# Patient Record
Sex: Male | Born: 1985
Health system: Southern US, Community
[De-identification: ages and names within clinical notes are randomized; demographics above are authoritative.]

## PROBLEM LIST (undated history)

## (undated) DIAGNOSIS — R7303 Prediabetes: Secondary | ICD-10-CM

## (undated) DIAGNOSIS — J45909 Unspecified asthma, uncomplicated: Secondary | ICD-10-CM

## (undated) DIAGNOSIS — E785 Hyperlipidemia, unspecified: Secondary | ICD-10-CM

## (undated) DIAGNOSIS — I10 Essential (primary) hypertension: Secondary | ICD-10-CM

## (undated) DIAGNOSIS — G4730 Sleep apnea, unspecified: Secondary | ICD-10-CM

## (undated) DIAGNOSIS — F419 Anxiety disorder, unspecified: Secondary | ICD-10-CM

## (undated) HISTORY — DX: Anxiety disorder, unspecified: F41.9

## (undated) HISTORY — DX: Unspecified asthma, uncomplicated: J45.909

## (undated) HISTORY — DX: Essential (primary) hypertension: I10

## (undated) HISTORY — PX: NO PAST SURGERIES: SHX2092

---

## 2015-05-21 ENCOUNTER — Ambulatory Visit (INDEPENDENT_AMBULATORY_CARE_PROVIDER_SITE_OTHER): Payer: PRIVATE HEALTH INSURANCE | Admitting: Physician Assistant

## 2015-05-21 ENCOUNTER — Encounter: Payer: Self-pay | Admitting: Physician Assistant

## 2015-05-21 VITALS — BP 150/90 | HR 82 | Temp 98.2°F | Resp 16 | Ht 74.0 in | Wt 260.2 lb

## 2015-05-21 DIAGNOSIS — N200 Calculus of kidney: Secondary | ICD-10-CM | POA: Diagnosis not present

## 2015-05-21 DIAGNOSIS — E782 Mixed hyperlipidemia: Secondary | ICD-10-CM

## 2015-05-21 DIAGNOSIS — Z23 Encounter for immunization: Secondary | ICD-10-CM | POA: Diagnosis not present

## 2015-05-21 DIAGNOSIS — Z7689 Persons encountering health services in other specified circumstances: Secondary | ICD-10-CM

## 2015-05-21 DIAGNOSIS — I10 Essential (primary) hypertension: Secondary | ICD-10-CM | POA: Diagnosis not present

## 2015-05-21 DIAGNOSIS — Z7189 Other specified counseling: Secondary | ICD-10-CM | POA: Diagnosis not present

## 2015-05-21 MED ORDER — HYDROCHLOROTHIAZIDE 12.5 MG PO CAPS: 12.5000 mg | ORAL_CAPSULE | Freq: Every day | ORAL | Status: DC

## 2015-05-21 NOTE — Patient Instructions (Signed)
Hypertension Hypertension, commonly called high blood pressure, is when the force of blood pumping through your arteries is too strong. Your arteries are the blood vessels that carry blood from your heart throughout your body. A blood pressure reading consists of a higher number over a lower number, such as 110/72. The higher number (systolic) is the pressure inside your arteries when your heart pumps. The lower number (diastolic) is the pressure inside your arteries when your heart relaxes. Ideally you want your blood pressure below 120/80. Hypertension forces your heart to work harder to pump blood. Your arteries may become narrow or stiff. Having untreated or uncontrolled hypertension can cause heart attack, stroke, kidney disease, and other problems. RISK FACTORS Some risk factors for high blood pressure are controllable. Others are not.  Risk factors you cannot control include:   Race. You may be at higher risk if you are African American.  Age. Risk increases with age.  Gender. Men are at higher risk than women before age 45 years. After age 65, women are at higher risk than men. Risk factors you can control include:  Not getting enough exercise or physical activity.  Being overweight.  Getting too much fat, sugar, calories, or salt in your diet.  Drinking too much alcohol. SIGNS AND SYMPTOMS Hypertension does not usually cause signs or symptoms. Extremely high blood pressure (hypertensive crisis) may cause headache, anxiety, shortness of breath, and nosebleed. DIAGNOSIS To check if you have hypertension, your health care provider will measure your blood pressure while you are seated, with your arm held at the level of your heart. It should be measured at least twice using the same arm. Certain conditions can cause a difference in blood pressure between your right and left arms. A blood pressure reading that is higher than normal on one occasion does not mean that you need treatment. If  it is not clear whether you have high blood pressure, you may be asked to return on a different day to have your blood pressure checked again. Or, you may be asked to monitor your blood pressure at home for 1 or more weeks. TREATMENT Treating high blood pressure includes making lifestyle changes and possibly taking medicine. Living a healthy lifestyle can help lower high blood pressure. You may need to change some of your habits. Lifestyle changes may include:  Following the DASH diet. This diet is high in fruits, vegetables, and whole grains. It is low in salt, red meat, and added sugars.  Keep your sodium intake below 2,300 mg per day.  Getting at least 30-45 minutes of aerobic exercise at least 4 times per week.  Losing weight if necessary.  Not smoking.  Limiting alcoholic beverages.  Learning ways to reduce stress. Your health care provider may prescribe medicine if lifestyle changes are not enough to get your blood pressure under control, and if one of the following is true:  You are 18-59 years of age and your systolic blood pressure is above 140.  You are 60 years of age or older, and your systolic blood pressure is above 150.  Your diastolic blood pressure is above 90.  You have diabetes, and your systolic blood pressure is over 140 or your diastolic blood pressure is over 90.  You have kidney disease and your blood pressure is above 140/90.  You have heart disease and your blood pressure is above 140/90. Your personal target blood pressure may vary depending on your medical conditions, your age, and other factors. HOME CARE INSTRUCTIONS    Have your blood pressure rechecked as directed by your health care provider.   Take medicines only as directed by your health care provider. Follow the directions carefully. Blood pressure medicines must be taken as prescribed. The medicine does not work as well when you skip doses. Skipping doses also puts you at risk for  problems.  Do not smoke.   Monitor your blood pressure at home as directed by your health care provider. SEEK MEDICAL CARE IF:   You think you are having a reaction to medicines taken.  You have recurrent headaches or feel dizzy.  You have swelling in your ankles.  You have trouble with your vision. SEEK IMMEDIATE MEDICAL CARE IF:  You develop a severe headache or confusion.  You have unusual weakness, numbness, or feel faint.  You have severe chest or abdominal pain.  You vomit repeatedly.  You have trouble breathing. MAKE SURE YOU:   Understand these instructions.  Will watch your condition.  Will get help right away if you are not doing well or get worse.   This information is not intended to replace advice given to you by your health care provider. Make sure you discuss any questions you have with your health care provider.   Document Released: 06/28/2005 Document Revised: 11/12/2014 Document Reviewed: 04/20/2013 Elsevier Interactive Patient Education 2016 Elsevier Inc.  

## 2015-05-21 NOTE — Progress Notes (Signed)
Patient: James Cooley, Male    DOB: [birth year], 29 y.o.   MRN: [id number] Visit Date: 05/21/2015  Today's Provider: Margaretann LovelessJennifer M Burnette, PA-C   Chief Complaint  Patient presents with  . Establish Care  . Annual Exam  . Hypertension   Subjective:    Annual physical exam James Cooley is a 29 y.o. male who presents today for health maintenance and complete physical. He feels well. He reports not exercising. He reports he is sleeping well.  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  He does report having reported high blood pressure for which he has never sought treatment.  He does report a strong family history of hypertension in his mother, father and siblings.  His wife is an Charity fundraiserN at [employer] an checks his BP regularly at home with readings in the 140s-150s/90s. He does not adhere to a heart healthy diet, but states they have been changing their eating habits recently to a healthier diet.   Review of Systems  Constitutional: Negative.   HENT: Negative.   Eyes: Negative.   Respiratory: Negative.   Cardiovascular: Negative.        Hypertension  Gastrointestinal: Negative.   Endocrine: Negative.   Genitourinary: Negative.   Musculoskeletal: Negative.   Skin: Negative.   Allergic/Immunologic: Negative.   Neurological: Negative.   Hematological: Negative.   Psychiatric/Behavioral: Negative.     Social History He  reports that he has never smoked. He has never used smokeless tobacco. He reports that he drinks alcohol. He reports that he does not use illicit drugs. Social History   Social History  . Marital Status: Married    Spouse Name: N/A  . Number of Children: N/A  . Years of Education: N/A   Social History Main Topics  . Smoking status: Never Smoker   . Smokeless tobacco: Never Used  . Alcohol Use: Yes     Comment: 4x a week 1-2 drinks  . Drug Use: No  . Sexual Activity: Not Asked   Other Topics Concern  . None   Social History Narrative  . None     Patient Active Problem List   Diagnosis Date Noted  . Hypertension 05/21/2015  . Kidney stones 05/21/2015    No past surgical history on file.  Family History  Family Status  Relation Status Death Age  . Mother Deceased 2461    breast cancer, pneumonia  . Father Alive   . Maternal Grandmother Alive    His family history includes Depression in his paternal grandmother; Diabetes in his maternal grandmother; Mental illness in his paternal grandmother.    No Known Allergies  Previous Medications   No medications on file    Patient Care Team: Margaretann LovelessJennifer M Burnette, PA-C as PCP - General (Family Medicine)     Objective:   Vitals: BP 150/90 mmHg  Pulse 82  Temp(Src) 98.2 F (36.8 C) (Oral)  Resp 16  Ht 6\' 2"  (1.88 m)  Wt 260 lb 3.2 oz (118.026 kg)  BMI 33.39 kg/m2   Physical Exam  Constitutional: He is oriented to person, place, and time. He appears well-developed and well-nourished.  HENT:  Head: Normocephalic and atraumatic.  Right Ear: External ear normal.  Left Ear: External ear normal.  Nose: Nose normal.  Mouth/Throat: Oropharynx is clear and moist.  Eyes: Conjunctivae and EOM are normal. Pupils are equal, round, and reactive to light. Right eye exhibits no discharge.  Neck: Normal range of motion. Neck supple. No  tracheal deviation present. No thyromegaly present.  Cardiovascular: Normal rate, regular rhythm, normal heart sounds and intact distal pulses.   No murmur heard. Pulmonary/Chest: Effort normal and breath sounds normal. No respiratory distress. He has no wheezes. He has no rales. He exhibits no tenderness.  Abdominal: Soft. He exhibits no distension and no mass. There is no tenderness. There is no rebound and no guarding.  Musculoskeletal: Normal range of motion. He exhibits no edema or tenderness.  Lymphadenopathy:    He has no cervical adenopathy.  Neurological: He is alert and oriented to person, place, and time. He has normal reflexes. No cranial  nerve deficit. He exhibits normal muscle tone. Coordination normal.  Skin: Skin is warm and dry. No rash noted. No erythema.  Psychiatric: He has a normal mood and affect. His behavior is normal. Judgment and thought content normal.     Depression Screen No flowsheet data found.    Assessment & Plan:     Routine Health Maintenance and Physical Exam  1. Encounter to establish care Normal physical exam today.  2. Essential hypertension Will check labs as below.  Starting HCTZ 12.5 mg.  His wife will check his BP at home occasionally over the next 4 weeks.  He is to bring a log when he returns to evaluate how well the medication is working.  Will increase to  if necessary at re-evaluation.  I will f/u pending lab results and also see him back in 4 weeks to recheck BP. - CBC with Differential - Comprehensive Metabolic Panel (CMET) - Lipid panel - TSH - hydrochlorothiazide (MICROZIDE) 12.5 MG capsule; Take 1 capsule (12.5 mg total) by mouth daily.  Dispense: 30 capsule; Refill: 1  3. Kidney stones H/O this.  None recently.  4. Need for influenza vaccination Flu vaccine was given today without complication. - Flu Vaccine QUAD 36+ mos IM   Exercise Activities and Dietary recommendations Goals    None      There is no immunization history for the selected administration types on file for this patient.  Health Maintenance  Topic Date Due  . HIV Screening  10/29/2001  . TETANUS/TDAP  10/29/2005  . INFLUENZA VACCINE  02/10/2015      Discussed health benefits of physical activity, and encouraged him to engage in regular exercise appropriate for his age and condition.    --------------------------------------------------------------------

## 2015-05-28 LAB — CBC WITH DIFFERENTIAL/PLATELET
BASOS ABS: 0 10*3/uL (ref 0.0–0.2)
BASOS: 0 %
EOS (ABSOLUTE): 0.1 10*3/uL (ref 0.0–0.4)
EOS: 1 %
HEMOGLOBIN: 15.6 g/dL (ref 12.6–17.7)
Hematocrit: 44.3 % (ref 37.5–51.0)
IMMATURE GRANS (ABS): 0 10*3/uL (ref 0.0–0.1)
Immature Granulocytes: 0 %
LYMPHS: 30 %
Lymphocytes Absolute: 2.5 10*3/uL (ref 0.7–3.1)
MCH: 30.6 pg (ref 26.6–33.0)
MCHC: 35.2 g/dL (ref 31.5–35.7)
MCV: 87 fL (ref 79–97)
MONOCYTES: 11 %
Monocytes Absolute: 0.9 10*3/uL (ref 0.1–0.9)
NEUTROS ABS: 4.8 10*3/uL (ref 1.4–7.0)
Neutrophils: 58 %
PLATELETS: 292 10*3/uL (ref 150–379)
RBC: 5.09 x10E6/uL (ref 4.14–5.80)
RDW: 12.8 % (ref 12.3–15.4)
WBC: 8.2 10*3/uL (ref 3.4–10.8)

## 2015-05-28 LAB — COMPREHENSIVE METABOLIC PANEL
ALT: 44 IU/L (ref 0–44)
AST: 29 IU/L (ref 0–40)
Albumin/Globulin Ratio: 2.1 (ref 1.1–2.5)
Albumin: 5.3 g/dL (ref 3.5–5.5)
Alkaline Phosphatase: 112 IU/L (ref 39–117)
BUN/Creatinine Ratio: 12 (ref 8–19)
BUN: 18 mg/dL (ref 6–20)
Bilirubin Total: 0.9 mg/dL (ref 0.0–1.2)
CALCIUM: 9.8 mg/dL (ref 8.7–10.2)
CO2: 28 mmol/L (ref 18–29)
CREATININE: 1.46 mg/dL — AB (ref 0.76–1.27)
Chloride: 94 mmol/L — ABNORMAL LOW (ref 97–106)
GFR calc Af Amer: 75 mL/min/{1.73_m2} (ref >59)
GFR, EST NON AFRICAN AMERICAN: 65 mL/min/{1.73_m2} (ref >59)
GLUCOSE: 92 mg/dL (ref 65–99)
Globulin, Total: 2.5 g/dL (ref 1.5–4.5)
POTASSIUM: 4.1 mmol/L (ref 3.5–5.2)
Sodium: 139 mmol/L (ref 136–144)
Total Protein: 7.8 g/dL (ref 6.0–8.5)

## 2015-05-28 LAB — TSH: TSH: 2.52 u[IU]/mL (ref 0.450–4.500)

## 2015-05-28 LAB — LIPID PANEL
CHOL/HDL RATIO: 8.8 ratio — AB (ref 0.0–5.0)
Cholesterol, Total: 238 mg/dL — ABNORMAL HIGH (ref 100–199)
HDL: 27 mg/dL — AB (ref >39)
TRIGLYCERIDES: 1044 mg/dL — AB (ref 0–149)

## 2015-05-28 NOTE — Addendum Note (Signed)
Addended by: Margaretann LovelessBURNETTE, Oveda Dadamo M on: 05/28/2015 01:49 PM   Modules accepted: Orders

## 2015-05-30 ENCOUNTER — Telehealth: Payer: Self-pay

## 2015-05-30 LAB — LIPID PANEL
CHOLESTEROL TOTAL: 219 mg/dL — AB (ref 100–199)
Chol/HDL Ratio: 6.3 ratio units — ABNORMAL HIGH (ref 0.0–5.0)
HDL: 35 mg/dL — ABNORMAL LOW (ref >39)
LDL Calculated: 104 mg/dL — ABNORMAL HIGH (ref 0–99)
Triglycerides: 398 mg/dL — ABNORMAL HIGH (ref 0–149)
VLDL Cholesterol Cal: 80 mg/dL — ABNORMAL HIGH (ref 5–40)

## 2015-05-30 NOTE — Telephone Encounter (Signed)
Patient advised as directed below.  Thanks,  -Xandra Laramee 

## 2015-05-30 NOTE — Telephone Encounter (Signed)
-----   Message from Margaretann LovelessJennifer M Burnette, PA-C sent at 05/30/2015  8:25 AM EST ----- Cholesterol is still slightly elevated, but not as bad as it had been previously. Total cholesterol is 219 (would like to try to get below 200), triglycerides is 398 (would like below 150).  Triglycerides is most closely related to your diet so try to avoid foods with high fat content, fried foods, and high cholesterol foods.  HDL is low at 35 (would like above 40).  This is the good cholesterol that can offer cardioprotection against bad cholesterol.  This can be increased with physical activity.  Try to add moderate physical activity for 30-40 minutes 4-5 days per week. LDL is slightly elevated at 104 (would like below 100).  Try to eat a heart healthy, low fat, low sodium diet and add physical activity.  You may also benefit from 1200mg  fish oil twice daily and/or red yeast rice supplement.  Try to make these lifestyle changes and we will recheck cholesterol in 6 months.  If still elevated like this may benefit from cholesterol lowering medication.

## 2015-06-18 ENCOUNTER — Ambulatory Visit (INDEPENDENT_AMBULATORY_CARE_PROVIDER_SITE_OTHER): Payer: PRIVATE HEALTH INSURANCE | Admitting: Physician Assistant

## 2015-06-18 ENCOUNTER — Encounter: Payer: Self-pay | Admitting: Physician Assistant

## 2015-06-18 VITALS — BP 130/98 | HR 81 | Temp 98.5°F | Resp 16 | Wt 265.2 lb

## 2015-06-18 DIAGNOSIS — I10 Essential (primary) hypertension: Secondary | ICD-10-CM

## 2015-06-18 MED ORDER — HYDROCHLOROTHIAZIDE 25 MG PO TABS: 25.0000 mg | ORAL_TABLET | Freq: Every day | ORAL | Status: DC

## 2015-06-18 NOTE — Progress Notes (Signed)
       Patient: James Cooley Male    DOB: 21-Nov-1985   29 y.o.   MRN: [id number] Visit Date: 06/18/2015  Today's Provider: Margaretann LovelessJennifer M Unnamed Hino, PA-C   Chief Complaint  Patient presents with  . Hypertension   Subjective:    HPI Hypertension: Patient here for follow-up of elevated blood pressure. He is not exercising and is adherent to low salt diet.  Blood pressure readings at home are between 148/90'. Patient blood pressure today is 130/98. Last office visit blood pressure was 150/90.Patient denies chest pain, fatigue, leg swelling, no blurry vision. He is compliant with current treatment. He is on HCTZ 12.5 mg daily.    No Known Allergies Previous Medications   HYDROCHLOROTHIAZIDE (MICROZIDE) 12.5 MG CAPSULE    Take 1 capsule (12.5 mg total) by mouth daily.    Review of Systems  Constitutional: Negative.   HENT: Negative.   Respiratory: Negative.   Cardiovascular: Negative for chest pain, palpitations and leg swelling.  Gastrointestinal: Negative.   Neurological: Negative for dizziness and headaches.    Social History  Substance Use Topics  . Smoking status: Never Smoker   . Smokeless tobacco: Never Used  . Alcohol Use: Yes     Comment: 4x a week 1-2 drinks   Objective:   BP 130/98 mmHg  Pulse 81  Temp(Src) 98.5 F (36.9 C) (Oral)  Resp 16  Wt 265 lb 3.2 oz (120.294 kg)  Physical Exam  Constitutional: He appears well-developed and well-nourished. No distress.  Cardiovascular: Normal rate, regular rhythm and normal heart sounds.  Exam reveals no gallop and no friction rub.   No murmur heard. Pulmonary/Chest: Effort normal and breath sounds normal. No respiratory distress. He has no wheezes. He has no rales.  Skin: He is not diaphoretic.  Vitals reviewed.     Assessment & Plan:     1. Essential hypertension Blood pressure is still not at goal. We will increase HCTZ from 12.5 mg to 25 mg daily. This prescription was filled as below in sent to his pharmacy.  We also discussed adding daily exercise for 30-40 minutes at least 3-4 days per week as well to help with blood pressure and weight loss. He voiced understanding. I will see him back in 2 months to recheck his blood pressure and see if any other medication needs to be added at that time. If he is doing well and blood pressure is at goal of 120/80 we will keep him with HCTZ 25 mg and then I will see him back in 3 months following that to recheck his blood work. He is to call the office in the meantime if he has any acute issues, questions or concerns. - hydrochlorothiazide (HYDRODIURIL) 25 MG tablet; Take 1 tablet (25 mg total) by mouth daily.  Dispense: 30 tablet; Refill: 1       Margaretann LovelessJennifer M Cade Dashner, PA-C  [facility]

## 2015-06-18 NOTE — Patient Instructions (Signed)
Hypertension Hypertension, commonly called high blood pressure, is when the force of blood pumping through your arteries is too strong. Your arteries are the blood vessels that carry blood from your heart throughout your body. A blood pressure reading consists of a higher number over a lower number, such as 110/72. The higher number (systolic) is the pressure inside your arteries when your heart pumps. The lower number (diastolic) is the pressure inside your arteries when your heart relaxes. Ideally you want your blood pressure below 120/80. Hypertension forces your heart to work harder to pump blood. Your arteries may become narrow or stiff. Having untreated or uncontrolled hypertension can cause heart attack, stroke, kidney disease, and other problems. RISK FACTORS Some risk factors for high blood pressure are controllable. Others are not.  Risk factors you cannot control include:   Race. You may be at higher risk if you are African American.  Age. Risk increases with age.  Gender. Men are at higher risk than women before age 45 years. After age 65, women are at higher risk than men. Risk factors you can control include:  Not getting enough exercise or physical activity.  Being overweight.  Getting too much fat, sugar, calories, or salt in your diet.  Drinking too much alcohol. SIGNS AND SYMPTOMS Hypertension does not usually cause signs or symptoms. Extremely high blood pressure (hypertensive crisis) may cause headache, anxiety, shortness of breath, and nosebleed. DIAGNOSIS To check if you have hypertension, your health care provider will measure your blood pressure while you are seated, with your arm held at the level of your heart. It should be measured at least twice using the same arm. Certain conditions can cause a difference in blood pressure between your right and left arms. A blood pressure reading that is higher than normal on one occasion does not mean that you need treatment. If  it is not clear whether you have high blood pressure, you may be asked to return on a different day to have your blood pressure checked again. Or, you may be asked to monitor your blood pressure at home for 1 or more weeks. TREATMENT Treating high blood pressure includes making lifestyle changes and possibly taking medicine. Living a healthy lifestyle can help lower high blood pressure. You may need to change some of your habits. Lifestyle changes may include:  Following the DASH diet. This diet is high in fruits, vegetables, and whole grains. It is low in salt, red meat, and added sugars.  Keep your sodium intake below 2,300 mg per day.  Getting at least 30-45 minutes of aerobic exercise at least 4 times per week.  Losing weight if necessary.  Not smoking.  Limiting alcoholic beverages.  Learning ways to reduce stress. Your health care provider may prescribe medicine if lifestyle changes are not enough to get your blood pressure under control, and if one of the following is true:  You are 18-59 years of age and your systolic blood pressure is above 140.  You are 60 years of age or older, and your systolic blood pressure is above 150.  Your diastolic blood pressure is above 90.  You have diabetes, and your systolic blood pressure is over 140 or your diastolic blood pressure is over 90.  You have kidney disease and your blood pressure is above 140/90.  You have heart disease and your blood pressure is above 140/90. Your personal target blood pressure may vary depending on your medical conditions, your age, and other factors. HOME CARE INSTRUCTIONS    Have your blood pressure rechecked as directed by your health care provider.   Take medicines only as directed by your health care provider. Follow the directions carefully. Blood pressure medicines must be taken as prescribed. The medicine does not work as well when you skip doses. Skipping doses also puts you at risk for  problems.  Do not smoke.   Monitor your blood pressure at home as directed by your health care provider. SEEK MEDICAL CARE IF:   You think you are having a reaction to medicines taken.  You have recurrent headaches or feel dizzy.  You have swelling in your ankles.  You have trouble with your vision. SEEK IMMEDIATE MEDICAL CARE IF:  You develop a severe headache or confusion.  You have unusual weakness, numbness, or feel faint.  You have severe chest or abdominal pain.  You vomit repeatedly.  You have trouble breathing. MAKE SURE YOU:   Understand these instructions.  Will watch your condition.  Will get help right away if you are not doing well or get worse.   This information is not intended to replace advice given to you by your health care provider. Make sure you discuss any questions you have with your health care provider.   Document Released: 06/28/2005 Document Revised: 11/12/2014 Document Reviewed: 04/20/2013 Elsevier Interactive Patient Education 2016 [employer].  Exercising to [employer] Exercising can help you to lose weight. In order to lose weight through exercise, you need to do vigorous-intensity exercise. You can tell that you are exercising with vigorous intensity if you are breathing very hard and fast and cannot hold a conversation while exercising. Moderate-intensity exercise helps to maintain your current weight. You can tell that you are exercising at a moderate level if you have a higher heart rate and faster breathing, but you are still able to hold a conversation. HOW OFTEN SHOULD I EXERCISE? Choose an activity that you enjoy and set realistic goals. Your health care provider can help you to make an activity plan that works for you. Exercise regularly as directed by your health care provider. This may include:  Doing resistance training twice each week, such as:  Push-ups.  Sit-ups.  Lifting weights.  Using resistance bands.  Doing  a given intensity of exercise for a given amount of time. Choose from these options:  150 minutes of moderate-intensity exercise every week.  75 minutes of vigorous-intensity exercise every week.  A mix of moderate-intensity and vigorous-intensity exercise every week. Children, pregnant women, people who are out of shape, people who are overweight, and older adults may need to consult a health care provider for individual recommendations. If you have any sort of medical condition, be sure to consult your health care provider before starting a new exercise program. WHAT ARE SOME ACTIVITIES THAT CAN HELP ME TO LOSE WEIGHT?   Walking at a rate of at least 4.5 miles an hour.  Jogging or running at a rate of 5 miles per hour.  Biking at a rate of at least 10 miles per hour.  Lap swimming.  Roller-skating or in-line skating.  Cross-country skiing.  Vigorous competitive sports, such as football, basketball, and soccer.  Jumping rope.  Aerobic dancing. HOW CAN I BE MORE ACTIVE IN MY DAY-TO-DAY ACTIVITIES?  Use the stairs instead of the elevator.  Take a walk during your lunch break.  If you drive, park your car farther away from work or school.  If you take public transportation, get off one stop early and  walk the rest of the way.  Make all of your phone calls while standing up and walking around.  Get up, stretch, and walk around every 30 minutes throughout the day. WHAT GUIDELINES SHOULD I FOLLOW WHILE EXERCISING?  Do not exercise so much that you hurt yourself, feel dizzy, or get very short of breath.  Consult your health care provider prior to starting a new exercise program.  Wear comfortable clothes and shoes with good support.  Drink plenty of water while you exercise to prevent dehydration or heat stroke. Body water is lost during exercise and must be replaced.  Work out until you breathe faster and your heart beats faster.   This information is not intended to  replace advice given to you by your health care provider. Make sure you discuss any questions you have with your health care provider.   Document Released: 07/31/2010 Document Revised: 07/19/2014 Document Reviewed: 11/29/2013 Elsevier Interactive Patient Education [employer]2016 Elsevier Inc.

## 2015-08-20 ENCOUNTER — Encounter: Payer: Self-pay | Admitting: Physician Assistant

## 2015-08-20 ENCOUNTER — Ambulatory Visit (INDEPENDENT_AMBULATORY_CARE_PROVIDER_SITE_OTHER): Payer: PRIVATE HEALTH INSURANCE | Admitting: Physician Assistant

## 2015-08-20 VITALS — BP 120/82 | HR 79 | Temp 97.6°F | Resp 16 | Wt 264.0 lb

## 2015-08-20 DIAGNOSIS — I10 Essential (primary) hypertension: Secondary | ICD-10-CM | POA: Diagnosis not present

## 2015-08-20 MED ORDER — HYDROCHLOROTHIAZIDE 25 MG PO TABS
25.0000 mg | ORAL_TABLET | Freq: Every day | ORAL | Status: DC
Start: 2015-08-20 — End: 2016-08-18

## 2015-08-20 NOTE — Patient Instructions (Addendum)
Food Choices to Lower Your Triglycerides Triglycerides are a type of fat in your blood. High levels of triglycerides can increase the risk of heart disease and stroke. If your triglyceride levels are high, the foods you eat and your eating habits are very important. Choosing the right foods can help lower your triglycerides.  WHAT GENERAL GUIDELINES DO I NEED TO FOLLOW?  Lose weight if you are overweight.   Limit or avoid alcohol.   Fill one half of your plate with vegetables and green salads.   Limit fruit to two servings a day. Choose fruit instead of juice.   Make one fourth of your plate whole grains. Look for the word "whole" as the first word in the ingredient list.  Fill one fourth of your plate with lean protein foods.  Enjoy fatty fish (such as salmon, mackerel, sardines, and tuna) three times a week.   Choose healthy fats.   Limit foods high in starch and sugar.  Eat more home-cooked food and less restaurant, buffet, and fast food.  Limit fried foods.  Cook foods using methods other than frying.  Limit saturated fats.  Check ingredient lists to avoid foods with partially hydrogenated oils (trans fats) in them. WHAT FOODS CAN I EAT?  Grains Whole grains, such as whole wheat or whole grain breads, crackers, cereals, and pasta. Unsweetened oatmeal, bulgur, barley, quinoa, or brown rice. Corn or whole wheat flour tortillas.  Vegetables Fresh or frozen vegetables (raw, steamed, roasted, or grilled). Green salads. Fruits All fresh, canned (in natural juice), or frozen fruits. Meat and Other Protein Products Ground beef (85% or leaner), grass-fed beef, or beef trimmed of fat. Skinless chicken or turkey. Ground chicken or turkey. Pork trimmed of fat. All fish and seafood. Eggs. Dried beans, peas, or lentils. Unsalted nuts or seeds. Unsalted canned or dry beans. Dairy Low-fat dairy products, such as skim or 1% milk, 2% or reduced-fat cheeses, low-fat ricotta or cottage  cheese, or plain low-fat yogurt. Fats and Oils Tub margarines without trans fats. Light or reduced-fat mayonnaise and salad dressings. Avocado. Safflower, olive, or canola oils. Natural peanut or almond butter. The items listed above may not be a complete list of recommended foods or beverages. Contact your dietitian for more options. WHAT FOODS ARE NOT RECOMMENDED?  Grains White bread. White pasta. White rice. Cornbread. Bagels, pastries, and croissants. Crackers that contain trans fat. Vegetables White potatoes. Corn. Creamed or fried vegetables. Vegetables in a cheese sauce. Fruits Dried fruits. Canned fruit in light or heavy syrup. Fruit juice. Meat and Other Protein Products Fatty cuts of meat. Ribs, chicken wings, bacon, sausage, bologna, salami, chitterlings, fatback, hot dogs, bratwurst, and packaged luncheon meats. Dairy Whole or 2% milk, cream, half-and-half, and cream cheese. Whole-fat or sweetened yogurt. Full-fat cheeses. Nondairy creamers and whipped toppings. Processed cheese, cheese spreads, or cheese curds. Sweets and Desserts Corn syrup, sugars, honey, and molasses. Candy. Jam and jelly. Syrup. Sweetened cereals. Cookies, pies, cakes, donuts, muffins, and ice cream. Fats and Oils Butter, stick margarine, lard, shortening, ghee, or bacon fat. Coconut, palm kernel, or palm oils. Beverages Alcohol. Sweetened drinks (such as sodas, lemonade, and fruit drinks or punches). The items listed above may not be a complete list of foods and beverages to avoid. Contact your dietitian for more information.   This information is not intended to replace advice given to you by your health care provider. Make sure you discuss any questions you have with your health care provider.   Document Released: 04/15/2004   Document Revised: 07/19/2014 Document Reviewed: 05/02/2013 Elsevier Interactive Patient Education 2016 [employer] Hypertension Hypertension, commonly called high blood  pressure, is when the force of blood pumping through your arteries is too strong. Your arteries are the blood vessels that carry blood from your heart throughout your body. A blood pressure reading consists of a higher number over a lower number, such as 110/72. The higher number (systolic) is the pressure inside your arteries when your heart pumps. The lower number (diastolic) is the pressure inside your arteries when your heart relaxes. Ideally you want your blood pressure below 120/80. Hypertension forces your heart to work harder to pump blood. Your arteries may become narrow or stiff. Having untreated or uncontrolled hypertension can cause heart attack, stroke, kidney disease, and other problems. RISK FACTORS Some risk factors for high blood pressure are controllable. Others are not.  Risk factors you cannot control include:   Race. You may be at higher risk if you are African American.  Age. Risk increases with age.  Gender. Men are at higher risk than women before age 29 years. After age 29, women are at higher risk than men. Risk factors you can control include:  Not getting enough exercise or physical activity.  Being overweight.  Getting too much fat, sugar, calories, or salt in your diet.  Drinking too much alcohol. SIGNS AND SYMPTOMS Hypertension does not usually cause signs or symptoms. Extremely high blood pressure (hypertensive crisis) may cause headache, anxiety, shortness of breath, and nosebleed. DIAGNOSIS To check if you have hypertension, your health care provider will measure your blood pressure while you are seated, with your arm held at the level of your heart. It should be measured at least twice using the same arm. Certain conditions can cause a difference in blood pressure between your right and left arms. A blood pressure reading that is higher than normal on one occasion does not mean that you need treatment. If it is not clear whether you have high blood pressure,  you may be asked to return on a different day to have your blood pressure checked again. Or, you may be asked to monitor your blood pressure at home for 1 or more weeks. TREATMENT Treating high blood pressure includes making lifestyle changes and possibly taking medicine. Living a healthy lifestyle can help lower high blood pressure. You may need to change some of your habits. Lifestyle changes may include:  Following the DASH diet. This diet is high in fruits, vegetables, and whole grains. It is low in salt, red meat, and added sugars.  Keep your sodium intake below 2,300 mg per day.  Getting at least 30-45 minutes of aerobic exercise at least 4 times per week.  Losing weight if necessary.  Not smoking.  Limiting alcoholic beverages.  Learning ways to reduce stress. Your health care provider may prescribe medicine if lifestyle changes are not enough to get your blood pressure under control, and if one of the following is true:  You are 5318-29 years of age and your systolic blood pressure is above 140.  You are 29 years of age or older, and your systolic blood pressure is above 150.  Your diastolic blood pressure is above 90.  You have diabetes, and your systolic blood pressure is over 140 or your diastolic blood pressure is over 90.  You have kidney disease and your blood pressure is above 140/90.  You have heart disease and your blood pressure is above 140/90. Your personal target blood pressure may  vary depending on your medical conditions, your age, and other factors. HOME CARE INSTRUCTIONS  Have your blood pressure rechecked as directed by your health care provider.   Take medicines only as directed by your health care provider. Follow the directions carefully. Blood pressure medicines must be taken as prescribed. The medicine does not work as well when you skip doses. Skipping doses also puts you at risk for problems.  Do not smoke.   Monitor your blood pressure at home  as directed by your health care provider. SEEK MEDICAL CARE IF:   You think you are having a reaction to medicines taken.  You have recurrent headaches or feel dizzy.  You have swelling in your ankles.  You have trouble with your vision. SEEK IMMEDIATE MEDICAL CARE IF:  You develop a severe headache or confusion.  You have unusual weakness, numbness, or feel faint.  You have severe chest or abdominal pain.  You vomit repeatedly.  You have trouble breathing. MAKE SURE YOU:   Understand these instructions.  Will watch your condition.  Will get help right away if you are not doing well or get worse.   This information is not intended to replace advice given to you by your health care provider. Make sure you discuss any questions you have with your health care provider.   Document Released: 06/28/2005 Document Revised: 11/12/2014 Document Reviewed: 04/20/2013 Elsevier Interactive Patient Education 2016 Elsevier Inc.  

## 2015-08-20 NOTE — Progress Notes (Signed)
       Patient: James Cooley Male    DOB: 1986-02-06   29 y.o.   MRN: [id number] Visit Date: 08/20/2015  Today's Provider: Margaretann Loveless, PA-C   Chief Complaint  Patient presents with  . Follow-up    HTN   Subjective:    HPI  Hypertension, follow-up:  BP Readings from Last 3 Encounters:  08/20/15 120/82  06/18/15 130/98  05/21/15 150/90    He was last seen for hypertension 2 months ago.  BP at that visit was 130/98. Management since that visit includes none. He reports good compliance with treatment. He is not having side effects.  He is exercising. He is running on the weekends. 2 miles. He is adherent to low salt diet.   Outside blood pressures are 120-125 and 80/90  He is experiencing none.  Patient denies chest pain, chest pressure/discomfort, fatigue, irregular heart beat and lower extremity edema.       Weight trend:  Wt Readings from Last 3 Encounters:  08/20/15 264 lb (119.75 kg)  06/18/15 265 lb 3.2 oz (120.294 kg)  05/21/15 260 lb 3.2 oz (118.026 kg)    Current diet: Paleo Diet.  ------------------------------------------------------------------------      No Known Allergies Previous Medications   HYDROCHLOROTHIAZIDE (HYDRODIURIL) 25 MG TABLET    Take 1 tablet (25 mg total) by mouth daily.    Review of Systems  Constitutional: Negative.   Respiratory: Negative.   Cardiovascular: Negative.   Gastrointestinal: Negative.   Genitourinary: Negative.   Neurological: Negative.     Social History  Substance Use Topics  . Smoking status: Never Smoker   . Smokeless tobacco: Never Used  . Alcohol Use: Yes     Comment: 4x a week 1-2 drinks   Objective:   BP 120/82 mmHg  Pulse 79  Temp(Src) 97.6 F (36.4 C) (Oral)  Resp 16  Wt 264 lb (119.75 kg)  Physical Exam  Constitutional: He appears well-developed and well-nourished. No distress.  HENT:  Head: Normocephalic and atraumatic.  Neck: Normal range of motion. Neck supple. No JVD  present. No tracheal deviation present. No thyromegaly present.  Cardiovascular: Normal rate, regular rhythm and normal heart sounds.  Exam reveals no gallop and no friction rub.   No murmur heard. Pulmonary/Chest: Effort normal and breath sounds normal. No respiratory distress. He has no wheezes. He has no rales.  Musculoskeletal: He exhibits no edema.  Lymphadenopathy:    He has no cervical adenopathy.  Skin: He is not diaphoretic.  Vitals reviewed.       Assessment & Plan:     1. Essential hypertension Blood pressure is now stable at current dose of hydrochlorothiazide 25 mg.  He is trying to make lifestyle changes including following a heart healthy diet and incorporating physical activity. I will see him back in 6 months and we will see how his blood pressure is doing at that time. Also at that time he will need to have labs checked to check kidney function on the hydrochlorothiazide as well as rechecking his cholesterol levels. If cholesterol is still elevated at that time we'll most likely start cholesterol-lowering medication. He is to call the office if he has any acute issues, questions or concerns in the meantime. - hydrochlorothiazide (HYDRODIURIL) 25 MG tablet; Take 1 tablet (25 mg total) by mouth daily.  Dispense: 90 tablet; Refill: 3       Margaretann Loveless, PA-C  [facility]

## 2015-11-25 ENCOUNTER — Ambulatory Visit (INDEPENDENT_AMBULATORY_CARE_PROVIDER_SITE_OTHER): Payer: PRIVATE HEALTH INSURANCE | Admitting: Physician Assistant

## 2015-11-25 ENCOUNTER — Encounter: Payer: Self-pay | Admitting: Physician Assistant

## 2015-11-25 VITALS — BP 160/100 | HR 73 | Temp 98.2°F | Resp 16 | Wt 267.8 lb

## 2015-11-25 DIAGNOSIS — L508 Other urticaria: Secondary | ICD-10-CM | POA: Diagnosis not present

## 2015-11-25 MED ORDER — HYDROXYZINE HCL 10 MG PO TABS: 10.0000 mg | ORAL_TABLET | Freq: Three times a day (TID) | ORAL | Status: DC | PRN

## 2015-11-25 NOTE — Progress Notes (Signed)
Patient: James Cooley Male    DOB: [birth year]   30 y.o.   MRN: [id number] Visit Date: 11/25/2015  Today's Provider: Margaretann LovelessJennifer M Burnette, PA-C   Chief Complaint  Patient presents with  . Rash   Subjective:    Rash This is a new problem. The current episode started in the past 7 days (Rash appeared Saturday night got worse on Sunday.). The problem has been gradually improving (has slowly been improving since yesterday) since onset. The affected locations include the chest, abdomen, groin, left upper leg, right buttock, left buttock, right upper leg, back and torso. The rash is characterized by itchiness, redness and swelling. He was exposed to a new medication and an insect bite/sting (possible mosquitos and recent amoxicillin). Pertinent negatives include no anorexia, congestion, cough, diarrhea, fatigue, fever, rhinorrhea, shortness of breath or vomiting. Past treatments include antihistamine and anti-itch cream (Took Benadryl this morning 25 MG, and being doing epsom salt bath.). The treatment provided no relief. There is no history of allergies, asthma, eczema or varicella.   Patient was on antibiotic, Amoxil, and finished it on Saturday. He was also prescribed Prednisone but patient did not take it.[facility]. He was seen on 11/11/15 and completed a 10 day course of amoxicillin. Last pill was Saturday morning before rash started.     No Known Allergies Previous Medications   HYDROCHLOROTHIAZIDE (HYDRODIURIL) 25 MG TABLET    Take 1 tablet (25 mg total) by mouth daily.   PROAIR HFA 108 (90 BASE) MCG/ACT INHALER    INHALE 2 PUFFS EVERY 6 HOURS    Review of Systems  Constitutional: Negative for fever and fatigue.  HENT: Negative for congestion and rhinorrhea.   Respiratory: Negative for cough, chest tightness, shortness of breath and wheezing.   Cardiovascular: Negative for chest pain, palpitations and leg swelling.    Gastrointestinal: Negative for nausea, vomiting, abdominal pain, diarrhea and anorexia.  Musculoskeletal: Negative for arthralgias, neck pain and neck stiffness.  Skin: Positive for rash.  Neurological: Negative for dizziness and weakness.    Social History  Substance Use Topics  . Smoking status: Never Smoker   . Smokeless tobacco: Never Used  . Alcohol Use: Yes     Comment: 4x a week 1-2 drinks   Objective:   BP 160/100 mmHg  Pulse 73  Temp(Src) 98.2 F (36.8 C) (Oral)  Resp 16  Wt 267 lb 12.8 oz (121.473 kg)  Physical Exam  Constitutional: He appears well-developed and well-nourished. No distress.  HENT:  Head: Normocephalic and atraumatic.  Neck: Normal range of motion. Neck supple. No JVD present. No tracheal deviation present. No thyromegaly present.  Cardiovascular: Normal rate, regular rhythm and normal heart sounds.  Exam reveals no gallop and no friction rub.   No murmur heard. Pulmonary/Chest: Effort normal and breath sounds normal. No respiratory distress. He has no wheezes. He has no rales.  Lymphadenopathy:    He has no cervical adenopathy.  Skin: Rash noted. Rash is urticarial (located on upper chest/neck above collar bone, groin, inner thighs, posterior thighs, buttocks and back). He is not diaphoretic.  Vitals reviewed.       Assessment & Plan:     1. Urticaria, acute Will give hydroxyzine for nighttime use to help with nighttime itching and help sleep. Advised to try zantac as well and to take the 6-day prednisone taper he was given by [facility]. He is to call if rash does not improve  or worsens. - hydrOXYzine (ATARAX/VISTARIL) 10 MG tablet; Take 1 tablet (10 mg total) by mouth 3 (three) times daily as needed. Drowsiness precautions.  Dispense: 30 tablet; Refill: 0       Margaretann Loveless, PA-C  [facility]

## 2015-11-25 NOTE — Patient Instructions (Addendum)
Hives Hives are itchy, red, swollen areas of the skin. They can vary in size and location on your body. Hives can come and go for hours or several days (acute hives) or for several weeks (chronic hives). Hives do not spread from person to person (noncontagious). They may get worse with scratching, exercise, and emotional stress. CAUSES   Allergic reaction to food, additives, or drugs.  Infections, including the common cold.  Illness, such as vasculitis, lupus, or thyroid disease.  Exposure to sunlight, heat, or cold.  Exercise.  Stress.  Contact with chemicals. SYMPTOMS   Red or white swollen patches on the skin. The patches may change size, shape, and location quickly and repeatedly.  Itching.  Swelling of the hands, feet, and face. This may occur if hives develop deeper in the skin. DIAGNOSIS  Your caregiver can usually tell what is wrong by performing a physical exam. Skin or blood tests may also be done to determine the cause of your hives. In some cases, the cause cannot be determined. TREATMENT  Mild cases usually get better with medicines such as antihistamines. Severe cases may require an emergency epinephrine injection. If the cause of your hives is known, treatment includes avoiding that trigger.  HOME CARE INSTRUCTIONS   Avoid causes that trigger your hives.  Take antihistamines as directed by your caregiver to reduce the severity of your hives. Non-sedating or low-sedating antihistamines are usually recommended. Do not drive while taking an antihistamine.  Take any other medicines prescribed for itching as directed by your caregiver.  Wear loose-fitting clothing.  Keep all follow-up appointments as directed by your caregiver. SEEK MEDICAL CARE IF:   You have persistent or severe itching that is not relieved with medicine.  You have painful or swollen joints. SEEK IMMEDIATE MEDICAL CARE IF:   You have a fever.  Your tongue or lips are swollen.  You have  trouble breathing or swallowing.  You feel tightness in the throat or chest.  You have abdominal pain. These problems may be the first sign of a life-threatening allergic reaction. Call your local emergency services (911 in [place] MAKE SURE YOU:   Understand these instructions.  Will watch your condition.  Will get help right away if you are not doing well or get worse.   This information is not intended to replace advice given to you by your health care provider. Make sure you discuss any questions you have with your health care provider.   Document Released: 06/28/2005 Document Revised: 07/03/2013 Document Reviewed: 09/21/2011 Elsevier Interactive Patient Education 2016 Elsevier Inc.  Hydroxyzine capsules or tablets What is this medicine? HYDROXYZINE (hye DROX i zeen) is an antihistamine. This medicine is used to treat allergy symptoms. It is also used to treat anxiety and tension. This medicine can be used with other medicines to induce sleep before surgery. This medicine may be used for other purposes; ask your health care provider or pharmacist if you have questions. What should I tell my health care provider before I take this medicine? They need to know if you have any of these conditions: -any chronic illness -difficulty passing urine -glaucoma -heart disease -kidney disease -liver disease -lung disease -an unusual or allergic reaction to hydroxyzine, cetirizine, other medicines, foods, dyes, or preservatives -pregnant or trying to get pregnant -breast-feeding How should I use this medicine? Take this medicine by mouth with a full glass of water. Follow the directions on the prescription label. You may take this medicine with food or on an   empty stomach. Take your medicine at regular intervals. Do not take your medicine more often than directed. Talk to your pediatrician regarding the use of this medicine in children. Special care may be needed. While this drug may be  prescribed for children as young as 6 years of age for selected conditions, precautions do apply. Patients over 65 years old may have a stronger reaction and need a smaller dose. Overdosage: If you think you have taken too much of this medicine contact a poison control center or emergency room at once. NOTE: This medicine is only for you. Do not share this medicine with others. What if I miss a dose? If you miss a dose, take it as soon as you can. If it is almost time for your next dose, take only that dose. Do not take double or extra doses. What may interact with this medicine? -alcohol -barbiturate medicines for sleep or seizures -medicines for colds, allergies -medicines for depression, anxiety, or emotional disturbances -medicines for pain -medicines for sleep -muscle relaxants This list may not describe all possible interactions. Give your health care provider a list of all the medicines, herbs, non-prescription drugs, or dietary supplements you use. Also tell them if you smoke, drink alcohol, or use illegal drugs. Some items may interact with your medicine. What should I watch for while using this medicine? Tell your doctor or health care professional if your symptoms do not improve. You may get drowsy or dizzy. Do not drive, use machinery, or do anything that needs mental alertness until you know how this medicine affects you. Do not stand or sit up quickly, especially if you are an older patient. This reduces the risk of dizzy or fainting spells. Alcohol may interfere with the effect of this medicine. Avoid alcoholic drinks. Your mouth may get dry. Chewing sugarless gum or sucking hard candy, and drinking plenty of water may help. Contact your doctor if the problem does not go away or is severe. This medicine may cause dry eyes and blurred vision. If you wear contact lenses you may feel some discomfort. Lubricating drops may help. See your eye doctor if the problem does not go away or is  severe. If you are receiving skin tests for allergies, tell your doctor you are using this medicine. What side effects may I notice from receiving this medicine? Side effects that you should report to your doctor or health care professional as soon as possible: -fast or irregular heartbeat -difficulty passing urine -seizures -slurred speech or confusion -tremor Side effects that usually do not require medical attention (report to your doctor or health care professional if they continue or are bothersome): -constipation -drowsiness -fatigue -headache -stomach upset This list may not describe all possible side effects. Call your doctor for medical advice about side effects. You may report side effects to FDA at 1-800-FDA-1088. Where should I keep my medicine? Keep out of the reach of children. Store at room temperature between 15 and 30 degrees C (59 and 86 degrees F). Keep container tightly closed. Throw away any unused medicine after the expiration date. NOTE: This sheet is a summary. It may not cover all possible information. If you have questions about this medicine, talk to your doctor, pharmacist, or health care provider.    2016, Elsevier/Gold Standard. (2007-11-10 14:50:59)  

## 2016-02-11 ENCOUNTER — Ambulatory Visit (INDEPENDENT_AMBULATORY_CARE_PROVIDER_SITE_OTHER): Payer: PRIVATE HEALTH INSURANCE | Admitting: Physician Assistant

## 2016-02-11 ENCOUNTER — Encounter: Payer: Self-pay | Admitting: Physician Assistant

## 2016-02-11 VITALS — BP 138/90 | HR 88 | Temp 98.0°F | Resp 16 | Wt 269.0 lb

## 2016-02-11 DIAGNOSIS — E669 Obesity, unspecified: Secondary | ICD-10-CM

## 2016-02-11 DIAGNOSIS — I10 Essential (primary) hypertension: Secondary | ICD-10-CM | POA: Diagnosis not present

## 2016-02-11 DIAGNOSIS — Z6834 Body mass index (BMI) 34.0-34.9, adult: Secondary | ICD-10-CM

## 2016-02-11 DIAGNOSIS — E782 Mixed hyperlipidemia: Secondary | ICD-10-CM | POA: Insufficient documentation

## 2016-02-11 NOTE — Progress Notes (Signed)
Patient: James Cooley Male    DOB: 03-20-1986   30 y.o.   MRN: [id number] Visit Date: 02/11/2016  Today's Provider: Margaretann Loveless, PA-C   Chief Complaint  Patient presents with  . Follow-up    HTN   Subjective:    HPI  Hypertension, follow-up:  BP Readings from Last 3 Encounters:  02/11/16 138/90  11/25/15 (!) 160/100  08/20/15 120/82    He was last seen for hypertension 6 months ago.  BP at that visit was 120/82. Management since that visit includes None. He reports excellent compliance with treatment. He is not having side effects.  He is exercising. Runs a mile on the weekends. He is adherent to low salt diet.   Outside blood pressures are 130's-120's and 80's. He is experiencing none.  Patient denies chest pain, chest pressure/discomfort, exertional chest pressure/discomfort, fatigue, irregular heart beat, lower extremity edema and palpitations.     Weight trend: stable Wt Readings from Last 3 Encounters:  02/11/16 269 lb (122 kg)  11/25/15 267 lb 12.8 oz (121.5 kg)  08/20/15 264 lb (119.7 kg)    Current diet: in general, a "healthy" diet  Still doing the Paleo diet.  ------------------------------------------------------------------------     Allergies  Allergen Reactions  . Amoxicillin Rash   Current Meds  Medication Sig  . hydrochlorothiazide (HYDRODIURIL) 25 MG tablet Take 1 tablet (25 mg total) by mouth daily.  . hydrOXYzine (ATARAX/VISTARIL) 10 MG tablet Take 1 tablet (10 mg total) by mouth 3 (three) times daily as needed. Drowsiness precautions.  . Multiple Vitamin (MULTI-VITAMINS) TABS Take by mouth.  . Omega-3 Fatty Acids (FISH OIL OMEGA-3 PO) Take by mouth.    Review of Systems  Constitutional: Negative.   Respiratory: Negative.   Cardiovascular: Negative.   Gastrointestinal: Negative.   Neurological: Negative.     Social History  Substance Use Topics  . Smoking status: Never Smoker  . Smokeless tobacco: Never Used    . Alcohol use Yes     Comment: 4x a week 1-2 drinks   Objective:   BP 138/90 (BP Location: Right Arm, Patient Position: Sitting, Cuff Size: Normal)   Pulse 88   Temp 98 F (36.7 C) (Oral)   Resp 16   Wt 269 lb (122 kg)   BMI 34.54 kg/m   Physical Exam  Constitutional: He appears well-developed and well-nourished. No distress.  HENT:  Head: Normocephalic and atraumatic.  Cardiovascular: Normal rate, regular rhythm and normal heart sounds.  Exam reveals no gallop and no friction rub.   No murmur heard. Pulmonary/Chest: Effort normal and breath sounds normal. No respiratory distress. He has no wheezes. He has no rales.  Musculoskeletal: He exhibits no edema.  Skin: He is not diaphoretic.  Vitals reviewed.     Assessment & Plan:     1. Essential hypertension Stable.Continue current medical treatment plan with HCTZ 25mg .Will check labs as below and f/u pending results. - Comprehensive metabolic panel - CBC with Differential - Amb ref to Medical Nutrition Therapy-MNT  2. Obesity Will check labs as below and f/u pending results. Will also refer for nutrition counseling. He has been trying to diet and exercise more but has gained 5 pounds over last 6 months. - Comprehensive metabolic panel - Lipid panel - CBC with Differential - Amb ref to Medical Nutrition Therapy-MNT  3. Body mass index (BMI) of 34.0-34.9 in adult See above medical treatment plan. - Comprehensive metabolic panel - CBC with Differential  4.  Hypercholesterolemia with hypertriglyceridemia Working on lifestyle modifications and taking fish oil 1200 mg bid. Will recheck labs and f/u pending results.  - Lipid panel      Margaretann Loveless, PA-C  [facility]

## 2016-02-11 NOTE — Patient Instructions (Signed)
Fat and Cholesterol Restricted Diet  Getting too much fat and cholesterol in your diet may cause health problems. Following this diet helps keep your fat and cholesterol at normal levels. This can keep you from getting sick.  WHAT TYPES OF FAT SHOULD I CHOOSE?  · Choose monosaturated and polyunsaturated fats. These are found in foods such as olive oil, canola oil, flaxseeds, walnuts, almonds, and seeds.  · Eat more omega-3 fats. Good choices include salmon, mackerel, sardines, tuna, flaxseed oil, and ground flaxseeds.  · Limit saturated fats. These are in animal products such as meats, butter, and cream. They can also be in plant products such as palm oil, palm kernel oil, and coconut oil.  ·  Avoid foods with partially hydrogenated oils in them. These contain trans fats. Examples of foods that have trans fats are stick margarine, some tub margarines, cookies, crackers, and other baked goods.  WHAT GENERAL GUIDELINES DO I NEED TO FOLLOW?   · Check food labels. Look for the words "trans fat" and "saturated fat."  · When preparing a meal:    Fill half of your plate with vegetables and green salads.    Fill one fourth of your plate with whole grains. Look for the word "whole" as the first word in the ingredient list.    Fill one fourth of your plate with lean protein foods.  · Limit fruit to two servings a day. Choose fruit instead of juice.  · Eat more foods with soluble fiber. Examples of foods with this type of fiber are apples, broccoli, carrots, beans, peas, and barley. Try to get 20-30 g (grams) of fiber per day.  · Eat more home-cooked foods. Eat less at restaurants and buffets.  · Limit or avoid alcohol.  · Limit foods high in starch and sugar.  · Limit fried foods.  · Cook foods without frying them. Baking, boiling, grilling, and broiling are all great options.  · Lose weight if you are overweight. Losing even a small amount of weight can help your overall health. It can also help prevent diseases such as  diabetes and heart disease.  WHAT FOODS CAN I EAT?  Grains  Whole grains, such as whole wheat or whole grain breads, crackers, cereals, and pasta. Unsweetened oatmeal, bulgur, barley, quinoa, or brown rice. Corn or whole wheat flour tortillas.  Vegetables  Fresh or frozen vegetables (raw, steamed, roasted, or grilled). Green salads.  Fruits  All fresh, canned (in natural juice), or frozen fruits.  Meat and Other Protein Products  Ground beef (85% or leaner), grass-fed beef, or beef trimmed of fat. Skinless chicken or turkey. Ground chicken or turkey. Pork trimmed of fat. All fish and seafood. Eggs. Dried beans, peas, or lentils. Unsalted nuts or seeds. Unsalted canned or dry beans.  Dairy  Low-fat dairy products, such as skim or 1% milk, 2% or reduced-fat cheeses, low-fat ricotta or cottage cheese, or plain low-fat yogurt.  Fats and Oils  Tub margarines without trans fats. Light or reduced-fat mayonnaise and salad dressings. Avocado. Olive, canola, sesame, or safflower oils. Natural peanut or almond butter (choose ones without added sugar and oil).  The items listed above may not be a complete list of recommended foods or beverages. Contact your dietitian for more options.  WHAT FOODS ARE NOT RECOMMENDED?  Grains  White bread. White pasta. White rice. Cornbread. Bagels, pastries, and croissants. Crackers that contain trans fat.  Vegetables  White potatoes. Corn. Creamed or fried vegetables. Vegetables in a cheese sauce.    Fruits  Dried fruits. Canned fruit in light or heavy syrup. Fruit juice.  Meat and Other Protein Products  Fatty cuts of meat. Ribs, chicken wings, bacon, sausage, bologna, salami, chitterlings, fatback, hot dogs, bratwurst, and packaged luncheon meats. Liver and organ meats.  Dairy  Whole or 2% milk, cream, half-and-half, and cream cheese. Whole milk cheeses. Whole-fat or sweetened yogurt. Full-fat cheeses. Nondairy creamers and whipped toppings. Processed cheese, cheese spreads, or cheese  curds.  Sweets and Desserts  Corn syrup, sugars, honey, and molasses. Candy. Jam and jelly. Syrup. Sweetened cereals. Cookies, pies, cakes, donuts, muffins, and ice cream.  Fats and Oils  Butter, stick margarine, lard, shortening, ghee, or bacon fat. Coconut, palm kernel, or palm oils.  Beverages  Alcohol. Sweetened drinks (such as sodas, lemonade, and fruit drinks or punches).  The items listed above may not be a complete list of foods and beverages to avoid. Contact your dietitian for more information.     This information is not intended to replace advice given to you by your health care provider. Make sure you discuss any questions you have with your health care provider.     Document Released: 12/28/2011 Document Revised: 07/19/2014 Document Reviewed: 09/27/2013  Elsevier Interactive Patient Education ©2016 [employer]

## 2016-02-19 ENCOUNTER — Telehealth: Payer: Self-pay | Admitting: Physician Assistant

## 2016-02-19 LAB — CBC WITH DIFFERENTIAL/PLATELET
BASOS: 0 %
Basophils Absolute: 0 10*3/uL (ref 0.0–0.2)
EOS (ABSOLUTE): 0.1 10*3/uL (ref 0.0–0.4)
Eos: 1 %
Hematocrit: 42.7 % (ref 37.5–51.0)
Hemoglobin: 14.9 g/dL (ref 12.6–17.7)
IMMATURE GRANULOCYTES: 0 %
Immature Grans (Abs): 0 10*3/uL (ref 0.0–0.1)
Lymphocytes Absolute: 2.9 10*3/uL (ref 0.7–3.1)
Lymphs: 34 %
MCH: 31 pg (ref 26.6–33.0)
MCHC: 34.9 g/dL (ref 31.5–35.7)
MCV: 89 fL (ref 79–97)
MONOS ABS: 0.8 10*3/uL (ref 0.1–0.9)
Monocytes: 9 %
NEUTROS ABS: 4.9 10*3/uL (ref 1.4–7.0)
NEUTROS PCT: 56 %
PLATELETS: 263 10*3/uL (ref 150–379)
RBC: 4.81 x10E6/uL (ref 4.14–5.80)
RDW: 12.5 % (ref 12.3–15.4)
WBC: 8.7 10*3/uL (ref 3.4–10.8)

## 2016-02-19 LAB — COMPREHENSIVE METABOLIC PANEL
ALBUMIN: 4.7 g/dL (ref 3.5–5.5)
ALK PHOS: 80 IU/L (ref 39–117)
ALT: 40 IU/L (ref 0–44)
AST: 27 IU/L (ref 0–40)
Albumin/Globulin Ratio: 1.8 (ref 1.2–2.2)
BUN / CREAT RATIO: 16 (ref 9–20)
BUN: 19 mg/dL (ref 6–20)
Bilirubin Total: 0.7 mg/dL (ref 0.0–1.2)
CALCIUM: 9.8 mg/dL (ref 8.7–10.2)
CO2: 26 mmol/L (ref 18–29)
CREATININE: 1.17 mg/dL (ref 0.76–1.27)
Chloride: 94 mmol/L — ABNORMAL LOW (ref 96–106)
GFR calc Af Amer: 97 mL/min/{1.73_m2} (ref >59)
GFR calc non Af Amer: 84 mL/min/{1.73_m2} (ref >59)
GLUCOSE: 82 mg/dL (ref 65–99)
Globulin, Total: 2.6 g/dL (ref 1.5–4.5)
Potassium: 4 mmol/L (ref 3.5–5.2)
Sodium: 137 mmol/L (ref 134–144)
TOTAL PROTEIN: 7.3 g/dL (ref 6.0–8.5)

## 2016-02-19 LAB — LIPID PANEL
CHOLESTEROL TOTAL: 249 mg/dL — AB (ref 100–199)
Chol/HDL Ratio: 7.3 ratio units — ABNORMAL HIGH (ref 0.0–5.0)
HDL: 34 mg/dL — ABNORMAL LOW (ref >39)
Triglycerides: 672 mg/dL (ref 0–149)

## 2016-02-19 MED ORDER — PRAVASTATIN SODIUM 20 MG PO TABS: 20.0000 mg | ORAL_TABLET | Freq: Every day | ORAL | 0 refills | Status: DC

## 2016-02-19 NOTE — Telephone Encounter (Signed)
Called and left VM for patient to call back to discuss lab results personally with me. If he calls back please send call to Joseline or Irving Burtonmily so they can pull me from room. Thanks.

## 2016-02-19 NOTE — Addendum Note (Signed)
Addended by: Margaretann LovelessBURNETTE, JENNIFER M on: 02/19/2016 05:47 PM   Modules accepted: Orders

## 2016-03-29 ENCOUNTER — Encounter: Payer: PRIVATE HEALTH INSURANCE | Attending: Physician Assistant | Admitting: Dietician

## 2016-03-29 ENCOUNTER — Encounter: Payer: Self-pay | Admitting: Dietician

## 2016-03-29 VITALS — Ht 74.0 in | Wt 269.2 lb

## 2016-03-29 DIAGNOSIS — Z713 Dietary counseling and surveillance: Secondary | ICD-10-CM | POA: Insufficient documentation

## 2016-03-29 DIAGNOSIS — E669 Obesity, unspecified: Secondary | ICD-10-CM | POA: Diagnosis not present

## 2016-03-29 DIAGNOSIS — I10 Essential (primary) hypertension: Secondary | ICD-10-CM

## 2016-03-29 NOTE — Patient Instructions (Addendum)
Alternate egg breakfast with cereal/milk breakfast. Try 1% milk in place of 2% milk. Add egg whites to 1 whole egg.  Sodium goal: no more than 2000 mg/day; 1500-ideal Try Ms. DASH such as lemon pepper, Fiesta lime,  Tostado taco bowls -0 mg sodium Try Morton's lite salt to help decrease salt intake.  Limit alcohol to no more 1 drink per evening.  Balance meals with 3-4 servings of carbohydrate, 2-4 oz. Lean protein and non-starchy vegetables.  Strive for 5-8 servings of fruits/vegetables per day.  Try a margarine with plant stanol or sterol. Refer to list.  Increase intake of soluble fiber with goal of 10 gms per day. Limit saturated fat to no more than 13 gms; 10 would be ideal. Avoid all trans fat.

## 2016-03-29 NOTE — Progress Notes (Signed)
Medical Nutrition Therapy: Visit start time: {Time; 1600 end time: 1715  Assessment:  Diagnosis: obesity, hypertension Past medical history: hyperlipidemia  02/18/16 T.Chol-249; HDL-34, LDL 104, Triglycerides- 672 Psychosocial issues/ stress concerns: Patient describes his stress as moderate and indicates "ok" as to how well he is dealing with his stress. Preferred learning method:  . Hands-on  Current weight: 269.2 lbs Height: 74 in Medications, supplements: see list Progress and evaluation:  Patient accompanied by his wife in for initial medical nutrition therapy appointment. He reports some positive changes in his diet such as cheerios/2% milk instead of eggs, bacon or sausage for breakfast. They are also preparing lunch to take to work instead of eating "out". His father in law prepares most of the evening meals; often high fat, high sodium choices. He drinks at least 3 alcohol drinks each evening; prefers bourbon. When asked, he states he salts everything. His favorite snack in the evening is chips. His present diet is low in fruits, vegetables, whole grains, fiber and calcium sources.  He states he has previously tried following the [employer] but cannot sustain. Physical activity: cardio 1-2 x per week for 15 minutes; is not consistent.  Dietary Intake:  Usual eating pattern includes3 meals and 0-2snacks per day. Dining out frequency: 3 meals per week.  Breakfast: cheerios/2% milk, coffee with creamer Snack: almonds occasinally Lunch: 2/3 cup brown rice, baked chicken, brussels sprouts or asparagus or broccoli Supper: [place] food or hamburger or barbeque or soups  Snack:  Chips, beer or bourbon Beverages: water, tea with Splenda for evening meal  Nutrition Care Education:     Weight control: Instructed on a meal plan based on 2000-2200 calories including carbohydrate counting and the need to balance lean protein with healthy carbohydrate sources, (especially whole grains) and  non-starchy vegetables. Discussed plate method of balancing meals. Gave and reviewed sample menu.  Hypertension: Instructed on guidelines for sodium and gave specific lower sodium product options to help stay within guidelines. Also, gave list of high sodium foods and lower sodium alternatives. Discussed importance of potassium in fruits and vegetables in blood pressure control. Hyperlipidemia:  Instructed on general guidelines to lower triglycerides and cholesterol. Also, gave and reviewed foods and soluble fiber content and sources of plant stanols/sterols.  Nutritional Diagnosis:  NI-5.10.2 Excessive mineral intake (specify): sodium As related to high sodium dinner meals and snacks..  As evidenced by deit history..  Intervention:  Alternate egg breakfast with cereal/milk breakfast. Try 1% milk in place of 2% milk. Add egg whites to 1 whole egg.  Sodium goal: no more than 2000 mg/day; 1500-ideal Try Ms. DASH such as lemon pepper, Fiesta lime,  Tostado taco bowls -0 mg sodium Try Morton's lite salt to help decrease salt intake.  Limit alcohol to no more 1 drink per evening.  Balance meals with 3-4 servings of carbohydrate, 2-4 oz. Lean protein and non-starchy vegetables.  Strive for 5-8 servings of fruits/vegetables per day.  Try a margarine with plant stanol or sterol. Refer to list.  Increase intake of soluble fiber with goal of 10 gms per day. Limit saturated fat to no more than 13 gms; 10 would be ideal. Avoid all trans fat.   Education Materials given:  . Food lists/ Planning A Balanced Meal . Dietary Guidelines for lowering triglycerides and cholesterol . Sources of Soluble Fiber . Sample meal pattern/ menus . Sources of Plant Stanols/Sterols . Goals/ instructions  Learner/ who was taught:  . Patient  . Spouse/ partner  Level of  understanding: . Partial understanding; needs review/ practice  Learning barriers: . None  Willingness to learn/ readiness for  change: . Eager, change in progress  Monitoring and Evaluation:  Dietary intake, exercise, , and body weight      follow up :04/27/16 at 10:30

## 2016-04-27 ENCOUNTER — Encounter: Payer: PRIVATE HEALTH INSURANCE | Attending: Physician Assistant | Admitting: Dietician

## 2016-04-27 ENCOUNTER — Encounter: Payer: Self-pay | Admitting: Dietician

## 2016-04-27 VITALS — Ht 74.0 in | Wt 261.2 lb

## 2016-04-27 DIAGNOSIS — Z713 Dietary counseling and surveillance: Secondary | ICD-10-CM | POA: Insufficient documentation

## 2016-04-27 DIAGNOSIS — E6609 Other obesity due to excess calories: Secondary | ICD-10-CM

## 2016-04-27 DIAGNOSIS — E669 Obesity, unspecified: Secondary | ICD-10-CM | POA: Diagnosis not present

## 2016-04-27 DIAGNOSIS — Z6833 Body mass index (BMI) 33.0-33.9, adult: Secondary | ICD-10-CM

## 2016-04-27 DIAGNOSIS — I10 Essential (primary) hypertension: Secondary | ICD-10-CM | POA: Insufficient documentation

## 2016-04-27 NOTE — Patient Instructions (Addendum)
Continue with previous goals:  Alternate egg breakfast with cereal/milk breakfast. Try 1% milk in place of 2% milk. Add egg whites to 1 whole egg.  Sodium goal: no more than 2000 mg/day; 1500-ideal Try Ms. DASH such as lemon pepper, Fiesta lime,  Tostado taco bowls -0 mg sodium Try Morton's lite salt to help decrease salt intake.  Limit alcohol to no more 1 drink per evening.  Balance meals with 3-4 servings of carbohydrate, 2-4 oz. Lean protein and non-starchy vegetables.  Strive for 5-8 servings of fruits/vegetables per day.  Increase intake of soluble fiber with goal of 10 gms per day. Limit saturated fat to no more than 13 gms; 10 would be ideal. Avoid all trans fat.  In addition: Increase exercise with goal of 5 days per week for 30 minutes. Increase 1% milk to 2 cups per day.

## 2016-04-27 NOTE — Progress Notes (Signed)
Medical Nutrition Therapy Follow-up visit:  Time with patient: 10:30-11:05 Visit #:2 ASSESSMENT:  Diagnosis:obesity, hypertension  Current weight:261.2 lbs    Height:74 in  Medications: See list Medical History: hyperlipidemia Progress and evaluation: Patient in for medical nutrition follow-up appointment. He had recent lab work done at work (finger stick) which indicate significant improvement in lipid profile. T.Chol was 155, HDL- 31, LDL-54, Triglycerides 350. BP was 122/80 also indicating improvement. He continues to make positive change in his diet. He has followed through to limit salt added and he and his wife are using Mrs. DASH seasonings instead. They are also using no salt added canned vegetables in addition to frozen vegetables. He is eating fruit for most evening snacks in place of chips. He is now working 2nd shift. Most of his lunch meals are prepared at home and he typically takes leftovers for his dinner meal. He drinks 3 (20oz) bottles of water daily. He has decreased his bourbon intake from 3 drinks per evening to 1(approx. 1 oz) after work.  He includes fruit at least 2x/day and vegetables at both lunch and dinner meal. He has decreased meals eaten "out" from 3 per week to 1.  His diet changes have resulted in an 8.2 lb weight loss in past month.  Physical activity:cardio, 2 days per week for 3 minutes   NUTRITION CARE EDUCATION: Weight control:  Commended patient on the significant diet changes he has made and on positive results. Reviewed food group servings needed to meet basic nutrient needs. Discussed again how making diet changes is a process and commended on his meal pattern of 3 meals and 2-3 small snacks rather than over- restricting.  Discussed how more consistent exercise can also help with lowering lipids, blood pressure and weight loss maintenance. Reviewed again dietary guidelines for lowering triglycerides. Hypertension:  Reviewed again how the changes he  has made to limit sodium and increase fruits and vegetables along with weight loss have contributed to improved blood pressure. Discussed how adequate calcium can play a role in blood pressure control and discussed sources.  INTERVENTION:  Continue with previous goals:  Alternate egg breakfast with cereal/milk breakfast. Try 1% milk in place of 2% milk. Add egg whites to 1 whole egg.  Sodium goal: no more than 2000 mg/day; 1500-ideal Try Ms. DASH such as lemon pepper, Fiesta lime,  Tostado taco bowls -0 mg sodium Try Morton's lite salt to help decrease salt intake.  Limit alcohol to no more 1 drink per evening.  Balance meals with 3-4 servings of carbohydrate, 2-4 oz. Lean protein and non-starchy vegetables.  Strive for 5-8 servings of fruits/vegetables per day.  Increase intake of soluble fiber with goal of 10 gms per day. Limit saturated fat to no more than 13 gms; 10 would be ideal. Avoid all trans fat.  In addition: Increase exercise with goal of 5 days per week for 30 minutes. Increase 1% milk to 2 cups per day  EDUCATION MATERIALS GIVEN:  . Goals/ instructions LEARNER/ who was taught:  . Patient  LEVEL OF UNDERSTANDING: . Verbalizes/ demonstrates competency LEARNING BARRIERS: . None  WILLINGNESS TO LEARN/READINESS FOR CHANGE: . Eager, change in progress  MONITORING AND EVALUATION:   No follow-up scheduled. Patient was encouraged to call if desires further help with his diet/nutrition.

## 2016-08-18 ENCOUNTER — Encounter: Payer: Self-pay | Admitting: Physician Assistant

## 2016-08-18 ENCOUNTER — Ambulatory Visit (INDEPENDENT_AMBULATORY_CARE_PROVIDER_SITE_OTHER): Payer: 59 | Admitting: Physician Assistant

## 2016-08-18 VITALS — BP 120/70 | HR 69 | Temp 97.8°F | Resp 16 | Ht 74.0 in | Wt 263.0 lb

## 2016-08-18 DIAGNOSIS — I10 Essential (primary) hypertension: Secondary | ICD-10-CM | POA: Diagnosis not present

## 2016-08-18 DIAGNOSIS — E782 Mixed hyperlipidemia: Secondary | ICD-10-CM | POA: Diagnosis not present

## 2016-08-18 DIAGNOSIS — Z0000 Encounter for general adult medical examination without abnormal findings: Secondary | ICD-10-CM | POA: Diagnosis not present

## 2016-08-18 MED ORDER — HYDROCHLOROTHIAZIDE 25 MG PO TABS: 25.0000 mg | ORAL_TABLET | Freq: Every day | ORAL | 3 refills | Status: DC

## 2016-08-18 MED ORDER — PRAVASTATIN SODIUM 20 MG PO TABS: 20.0000 mg | ORAL_TABLET | Freq: Every day | ORAL | 3 refills | Status: DC

## 2016-08-18 NOTE — Progress Notes (Signed)
Patient: James Cooley, Male    DOB: 1985/12/05, 31 y.o.   MRN: [id number] Visit Date: 08/18/2016  Today's Provider: Margaretann Loveless, PA-C   Chief Complaint  Patient presents with  . Annual Exam   Subjective:    Annual physical exam James Cooley is a 31 y.o. male who presents today for health maintenance and complete physical. He feels well. He reports exercising some. He reports he is sleeping well. -----------------------------------------------------------------   Review of Systems  Constitutional: Negative.   HENT: Negative.   Eyes: Negative.   Respiratory: Negative.   Cardiovascular: Negative.   Gastrointestinal: Negative.   Endocrine: Negative.   Genitourinary: Negative.   Musculoskeletal: Negative.   Skin: Negative.   Allergic/Immunologic: Negative.   Neurological: Negative.   Hematological: Negative.   Psychiatric/Behavioral: Negative.     Social History      He  reports that he has never smoked. He has never used smokeless tobacco. He reports that he drinks alcohol. He reports that he does not use drugs.       Social History   Social History  . Marital status: Married    Spouse name: N/A  . Number of children: N/A  . Years of education: N/A   Social History Main Topics  . Smoking status: Never Smoker  . Smokeless tobacco: Never Used  . Alcohol use Yes     Comment: 1 per day  . Drug use: No  . Sexual activity: Yes   Other Topics Concern  . None   Social History Narrative  . None    Past Medical History:  Diagnosis Date  . Asthma      Patient Active Problem List   Diagnosis Date Noted  . Hypercholesterolemia with hypertriglyceridemia 02/11/2016  . Hypertension 05/21/2015  . Kidney stones 05/21/2015    History reviewed. No pertinent surgical history.  Family History        Family Status  Relation Status  . Mother Deceased at age 84   breast cancer, pneumonia  . Father Alive  . Maternal Grandmother Alive  .  Paternal Grandmother         His family history includes Depression in his paternal grandmother; Diabetes in his maternal grandmother; Mental illness in his paternal grandmother.     Allergies  Allergen Reactions  . Amoxicillin Rash     Current Outpatient Prescriptions:  .  hydrochlorothiazide (HYDRODIURIL) 25 MG tablet, Take 1 tablet (25 mg total) by mouth daily., Disp: 90 tablet, Rfl: 3 .  Multiple Vitamin (MULTI-VITAMINS) TABS, Take by mouth., Disp: , Rfl:  .  Omega-3 Fatty Acids (FISH OIL OMEGA-3 PO), Take by mouth., Disp: , Rfl:  .  pravastatin (PRAVACHOL) 20 MG tablet, Take 1 tablet (20 mg total) by mouth daily., Disp: 90 tablet, Rfl: 0 .  hydrOXYzine (ATARAX/VISTARIL) 10 MG tablet, Take 1 tablet (10 mg total) by mouth 3 (three) times daily as needed. Drowsiness precautions. (Patient not taking: Reported on 03/29/2016), Disp: 30 tablet, Rfl: 0   Patient Care Team: Margaretann Loveless, PA-C as PCP - General (Family Medicine)      Objective:   Vitals: BP 120/70 (BP Location: Right Arm, Patient Position: Sitting, Cuff Size: Normal)   Pulse 69   Temp 97.8 F (36.6 C) (Oral)   Resp 16   Ht 6\' 2"  (1.88 m)   Wt 263 lb (119.3 kg)   BMI 33.77 kg/m    Physical Exam  Constitutional: He is oriented to person, place,  and time. He appears well-developed and well-nourished.  HENT:  Head: Normocephalic and atraumatic.  Right Ear: Hearing, tympanic membrane, external ear and ear canal normal.  Left Ear: Hearing, tympanic membrane, external ear and ear canal normal.  Nose: Right sinus exhibits no maxillary sinus tenderness and no frontal sinus tenderness. Left sinus exhibits no maxillary sinus tenderness and no frontal sinus tenderness.  Mouth/Throat: Uvula is midline, oropharynx is clear and moist and mucous membranes are normal.  Eyes: Conjunctivae and EOM are normal. Pupils are equal, round, and reactive to light. Right eye exhibits no discharge.  Neck: Normal range of motion. Neck  supple. No tracheal deviation present. No thyromegaly present.  Cardiovascular: Normal rate, regular rhythm, normal heart sounds and intact distal pulses.   No murmur heard. Pulmonary/Chest: Effort normal and breath sounds normal. No respiratory distress. He has no wheezes. He has no rales. He exhibits no tenderness.  Abdominal: Soft. He exhibits no distension and no mass. There is no tenderness. There is no rebound and no guarding.  Genitourinary:  Genitourinary Comments: deferred  Musculoskeletal: Normal range of motion. He exhibits no edema or tenderness.  Lymphadenopathy:    He has no cervical adenopathy.  Neurological: He is alert and oriented to person, place, and time. He has normal reflexes. No cranial nerve deficit. He exhibits normal muscle tone. Coordination normal.  Skin: Skin is warm and dry. No rash noted. No erythema.  Psychiatric: He has a normal mood and affect. His behavior is normal. Judgment and thought content normal.     Depression Screen PHQ 2/9 Scores 03/29/2016  PHQ - 2 Score 0      Assessment & Plan:     Routine Health Maintenance and Physical Exam  Exercise Activities and Dietary recommendations Goals    None      Immunization History  Administered Date(s) Administered  . Influenza,inj,Quad PF,36+ Mos 05/21/2015    Health Maintenance  Topic Date Due  . HIV Screening  10/29/2001  . TETANUS/TDAP  10/29/2005  . INFLUENZA VACCINE  02/10/2016     Discussed health benefits of physical activity, and encouraged him to engage in regular exercise appropriate for his age and condition.    1. Annual physical exam Normal physical exam.  2. Hypercholesterolemia with hypertriglyceridemia Stable. Insurance health screen showed TC 155, HDL 31, LDL 54 and Trig 350. Continue pravastatin 20mg  as below. Will check labs as below and f/u pending results. - CBC with Differential/Platelet - Comprehensive metabolic panel - pravastatin (PRAVACHOL) 20 MG tablet;  Take 1 tablet (20 mg total) by mouth daily.  Dispense: 90 tablet; Refill: 3  3. Essential hypertension Stable and much improved after visits with nutritionist. Continue HCTZ 25mg . Will check labs as below and f/u pending results. - CBC with Differential/Platelet - Comprehensive metabolic panel - hydrochlorothiazide (HYDRODIURIL) 25 MG tablet; Take 1 tablet (25 mg total) by mouth daily.  Dispense: 90 tablet; Refill: 3  --------------------------------------------------------------------    Margaretann LovelessJennifer M Petra Dumler, PA-C  [facility]

## 2016-08-18 NOTE — Patient Instructions (Signed)

## 2016-08-19 ENCOUNTER — Telehealth: Payer: Self-pay

## 2016-08-19 LAB — CBC WITH DIFFERENTIAL/PLATELET
Basophils Absolute: 0 10*3/uL (ref 0.0–0.2)
Basos: 0 %
EOS (ABSOLUTE): 0.1 10*3/uL (ref 0.0–0.4)
EOS: 1 %
HEMATOCRIT: 43 % (ref 37.5–51.0)
HEMOGLOBIN: 15.3 g/dL (ref 13.0–17.7)
Immature Grans (Abs): 0 10*3/uL (ref 0.0–0.1)
Immature Granulocytes: 1 %
LYMPHS ABS: 1.7 10*3/uL (ref 0.7–3.1)
Lymphs: 26 %
MCH: 30.5 pg (ref 26.6–33.0)
MCHC: 35.6 g/dL (ref 31.5–35.7)
MCV: 86 fL (ref 79–97)
MONOCYTES: 9 %
Monocytes Absolute: 0.6 10*3/uL (ref 0.1–0.9)
Neutrophils Absolute: 4.1 10*3/uL (ref 1.4–7.0)
Neutrophils: 63 %
Platelets: 256 10*3/uL (ref 150–379)
RBC: 5.01 x10E6/uL (ref 4.14–5.80)
RDW: 12.3 % (ref 12.3–15.4)
WBC: 6.6 10*3/uL (ref 3.4–10.8)

## 2016-08-19 LAB — COMPREHENSIVE METABOLIC PANEL
ALT: 37 IU/L (ref 0–44)
AST: 28 IU/L (ref 0–40)
Albumin/Globulin Ratio: 1.8 (ref 1.2–2.2)
Albumin: 4.6 g/dL (ref 3.5–5.5)
Alkaline Phosphatase: 82 IU/L (ref 39–117)
BUN / CREAT RATIO: 14 (ref 9–20)
BUN: 16 mg/dL (ref 6–20)
Bilirubin Total: 0.6 mg/dL (ref 0.0–1.2)
CALCIUM: 9.4 mg/dL (ref 8.7–10.2)
CO2: 26 mmol/L (ref 18–29)
CREATININE: 1.11 mg/dL (ref 0.76–1.27)
Chloride: 98 mmol/L (ref 96–106)
GFR calc Af Amer: 103 mL/min/{1.73_m2} (ref >59)
GFR calc non Af Amer: 89 mL/min/{1.73_m2} (ref >59)
GLOBULIN, TOTAL: 2.6 g/dL (ref 1.5–4.5)
Glucose: 107 mg/dL — ABNORMAL HIGH (ref 65–99)
Potassium: 3.8 mmol/L (ref 3.5–5.2)
SODIUM: 139 mmol/L (ref 134–144)
Total Protein: 7.2 g/dL (ref 6.0–8.5)

## 2016-08-19 NOTE — Telephone Encounter (Signed)
-----   Message from Margaretann LovelessJennifer M Burnette, PA-C sent at 08/19/2016  8:20 AM EST ----- All labs are within normal limits and stable.  Thanks! -JB

## 2016-08-19 NOTE — Telephone Encounter (Signed)
Advised pt of lab results. Pt verbally acknowledges understanding. [name], CMA   

## 2016-10-02 ENCOUNTER — Other Ambulatory Visit: Payer: Self-pay

## 2016-10-02 DIAGNOSIS — E782 Mixed hyperlipidemia: Secondary | ICD-10-CM

## 2016-10-02 MED ORDER — PRAVASTATIN SODIUM 20 MG PO TABS: 20.0000 mg | ORAL_TABLET | Freq: Every day | ORAL | 3 refills | Status: DC

## 2016-10-02 NOTE — Telephone Encounter (Signed)
Please Review.  [facility] prescriptio refill request.  Paravastatin SOD TAB  Last Prescribed was 08/18/16 and [facility].

## 2016-10-04 ENCOUNTER — Telehealth: Payer: Self-pay | Admitting: Physician Assistant

## 2016-10-04 ENCOUNTER — Other Ambulatory Visit: Payer: Self-pay | Admitting: Physician Assistant

## 2016-10-04 DIAGNOSIS — E782 Mixed hyperlipidemia: Secondary | ICD-10-CM

## 2016-10-04 MED ORDER — PRAVASTATIN SODIUM 20 MG PO TABS: 20.0000 mg | ORAL_TABLET | Freq: Every day | ORAL | 3 refills | Status: DC

## 2016-10-04 NOTE — Progress Notes (Signed)
Sent in pravastatin as I had accidentally printed on 10/02/16

## 2016-10-04 NOTE — Telephone Encounter (Signed)
Already corrected this morning.

## 2016-10-04 NOTE — Telephone Encounter (Signed)
Please review-aa 

## 2016-10-04 NOTE — Telephone Encounter (Signed)
Pt contacted office for refill request on the following medications: pravastatin (PRAVACHOL) 20 MG tablet [facility]. It looks likes it was approved on 10/02/16 but it was printed and pt would like it sent to [facility]. Please advise. Thanks TNP

## 2016-10-04 NOTE — Telephone Encounter (Signed)
Thanks-aa

## 2016-11-25 ENCOUNTER — Telehealth: Payer: Self-pay | Admitting: Physician Assistant

## 2016-11-25 ENCOUNTER — Other Ambulatory Visit: Payer: Self-pay | Admitting: Physician Assistant

## 2016-11-25 DIAGNOSIS — I10 Essential (primary) hypertension: Secondary | ICD-10-CM

## 2016-11-25 MED ORDER — HYDROCHLOROTHIAZIDE 25 MG PO TABS: 25.0000 mg | ORAL_TABLET | Freq: Every day | ORAL | 3 refills | Status: DC

## 2016-11-25 NOTE — Telephone Encounter (Signed)
Had CPE 08/18/2016. Allene DillonEmily Drozdowski, CMA

## 2016-11-25 NOTE — Telephone Encounter (Signed)
[facility] faxed a request for the following medication. Thanks CC

## 2016-11-25 NOTE — Telephone Encounter (Signed)
Resent HCTZ to [facility].

## 2017-10-02 ENCOUNTER — Other Ambulatory Visit: Payer: Self-pay | Admitting: Physician Assistant

## 2017-10-02 DIAGNOSIS — I10 Essential (primary) hypertension: Secondary | ICD-10-CM

## 2017-11-21 ENCOUNTER — Other Ambulatory Visit: Payer: Self-pay | Admitting: Physician Assistant

## 2017-11-21 DIAGNOSIS — E782 Mixed hyperlipidemia: Secondary | ICD-10-CM

## 2018-02-06 ENCOUNTER — Encounter: Payer: Self-pay | Admitting: Physician Assistant

## 2018-02-06 ENCOUNTER — Ambulatory Visit (INDEPENDENT_AMBULATORY_CARE_PROVIDER_SITE_OTHER): Payer: 59 | Admitting: Physician Assistant

## 2018-02-06 VITALS — BP 140/88 | HR 71 | Temp 98.4°F | Resp 16 | Ht 74.0 in | Wt 282.6 lb

## 2018-02-06 DIAGNOSIS — I10 Essential (primary) hypertension: Secondary | ICD-10-CM

## 2018-02-06 DIAGNOSIS — Z114 Encounter for screening for human immunodeficiency virus [HIV]: Secondary | ICD-10-CM

## 2018-02-06 DIAGNOSIS — Z6836 Body mass index (BMI) 36.0-36.9, adult: Secondary | ICD-10-CM

## 2018-02-06 DIAGNOSIS — Z23 Encounter for immunization: Secondary | ICD-10-CM

## 2018-02-06 DIAGNOSIS — E782 Mixed hyperlipidemia: Secondary | ICD-10-CM | POA: Diagnosis not present

## 2018-02-06 DIAGNOSIS — Z0000 Encounter for general adult medical examination without abnormal findings: Secondary | ICD-10-CM | POA: Diagnosis not present

## 2018-02-06 NOTE — Progress Notes (Signed)
Patient: James Cooley, Male    DOB: August 27, 1985, 32 y.o.   MRN: [id number] Visit Date: 02/06/2018  Today's Provider: Margaretann Loveless, PA-C   Chief Complaint  Patient presents with  . Annual Exam   Subjective:    Annual physical exam James Cooley is a 32 y.o. male who presents today for health maintenance and complete physical. He feels well. He reports exercising none. He reports he is sleeping fairly well.  He does report his wife is pregnant with their first child, a boy. She is due in November 2019. -----------------------------------------------------------------   Review of Systems  Constitutional: Negative.   HENT: Negative.   Eyes: Negative.   Respiratory: Negative.   Cardiovascular: Negative.   Gastrointestinal: Negative.   Endocrine: Negative.   Genitourinary: Negative.   Musculoskeletal: Negative.   Skin: Negative.   Allergic/Immunologic: Negative.   Neurological: Negative.   Hematological: Negative.   Psychiatric/Behavioral: Negative.     Social History      He  reports that he has never smoked. He has never used smokeless tobacco. He reports that he drinks alcohol. He reports that he does not use drugs.       Social History   Socioeconomic History  . Marital status: Married    Spouse name: Not on file  . Number of children: Not on file  . Years of education: Not on file  . Highest education level: Not on file  Occupational History  . Not on file  Social Needs  . Financial resource strain: Not on file  . Food insecurity:    Worry: Not on file    Inability: Not on file  . Transportation needs:    Medical: Not on file    Non-medical: Not on file  Tobacco Use  . Smoking status: Never Smoker  . Smokeless tobacco: Never Used  Substance and Sexual Activity  . Alcohol use: Yes    Comment: 1 per day  . Drug use: No  . Sexual activity: Yes  Lifestyle  . Physical activity:    Days per week: Not on file    Minutes per session:  Not on file  . Stress: Not on file  Relationships  . Social connections:    Talks on phone: Not on file    Gets together: Not on file    Attends religious service: Not on file    Active member of club or organization: Not on file    Attends meetings of clubs or organizations: Not on file    Relationship status: Not on file  Other Topics Concern  . Not on file  Social History Narrative  . Not on file    Past Medical History:  Diagnosis Date  . Asthma      Patient Active Problem List   Diagnosis Date Noted  . Hypercholesterolemia with hypertriglyceridemia 02/11/2016  . Hypertension 05/21/2015  . Kidney stones 05/21/2015    History reviewed. No pertinent surgical history.  Family History        Family Status  Relation Name Status  . Mother  Deceased at age 34       breast cancer, pneumonia  . Father  Alive  . MGM  Alive  . PGM  (Not Specified)        His family history includes Depression in his paternal grandmother; Diabetes in his maternal grandmother; Mental illness in his paternal grandmother.      Allergies  Allergen Reactions  . Amoxicillin Rash  Current Outpatient Medications:  Marland Kitchen.  Glucosamine-Chondroitin (OSTEO BI-FLEX REGULAR STRENGTH PO), Take by mouth., Disp: , Rfl:  .  hydrochlorothiazide (HYDRODIURIL) 25 MG tablet, TAKE 1 TABLET BY MOUTH  DAILY, Disp: 90 tablet, Rfl: 3 .  Omega-3 Fatty Acids (FISH OIL OMEGA-3 PO), Take by mouth., Disp: , Rfl:  .  pravastatin (PRAVACHOL) 20 MG tablet, TAKE 1 TABLET BY MOUTH  DAILY, Disp: 90 tablet, Rfl: 3 .  Multiple Vitamin (MULTI-VITAMINS) TABS, Take by mouth., Disp: , Rfl:    Patient Care Team: Margaretann LovelessBurnette, Lowell Makara M, PA-C as PCP - General (Family Medicine)      Objective:   Vitals: BP 140/88 (BP Location: Left Arm, Patient Position: Sitting, Cuff Size: Large)   Pulse 71   Temp 98.4 F (36.9 C) (Oral)   Resp 16   Ht 6\' 2"  (1.88 m)   Wt 282 lb 9.6 oz (128.2 kg)   BMI 36.28 kg/m    Vitals:   02/06/18  1022  BP: 140/88  Pulse: 71  Resp: 16  Temp: 98.4 F (36.9 C)  TempSrc: Oral  Weight: 282 lb 9.6 oz (128.2 kg)  Height: 6\' 2"  (1.88 m)     Physical Exam  Constitutional: He is oriented to person, place, and time. He appears well-developed and well-nourished.  HENT:  Head: Normocephalic and atraumatic.  Right Ear: Hearing, tympanic membrane, external ear and ear canal normal.  Left Ear: Hearing, tympanic membrane, external ear and ear canal normal.  Nose: Nose normal.  Mouth/Throat: Uvula is midline, oropharynx is clear and moist and mucous membranes are normal.  Eyes: Pupils are equal, round, and reactive to light. Conjunctivae and EOM are normal. Right eye exhibits no discharge.  Neck: Normal range of motion. Neck supple. No tracheal deviation present. No thyromegaly present.  Cardiovascular: Normal rate, regular rhythm, normal heart sounds and intact distal pulses.  No murmur heard. Pulmonary/Chest: Effort normal and breath sounds normal. No respiratory distress. He has no wheezes. He has no rales. He exhibits no tenderness.  Abdominal: Soft. Bowel sounds are normal. He exhibits no distension and no mass. There is no tenderness. There is no rebound and no guarding.  Musculoskeletal: Normal range of motion. He exhibits no edema or tenderness.  Lymphadenopathy:    He has no cervical adenopathy.  Neurological: He is alert and oriented to person, place, and time. He has normal reflexes. He displays normal reflexes. No cranial nerve deficit. He exhibits normal muscle tone. Coordination normal.  Skin: Skin is warm and dry. No rash noted. No erythema.  Psychiatric: He has a normal mood and affect. His behavior is normal. Judgment and thought content normal.  Vitals reviewed.    Depression Screen PHQ 2/9 Scores 02/06/2018 03/29/2016  PHQ - 2 Score 0 0      Assessment & Plan:     Routine Health Maintenance and Physical Exam  Exercise Activities and Dietary recommendations Goals     None      Immunization History  Administered Date(s) Administered  . Influenza,inj,Quad PF,6+ Mos 05/21/2015    Health Maintenance  Topic Date Due  . HIV Screening  10/29/2001  . TETANUS/TDAP  10/29/2005  . INFLUENZA VACCINE  02/09/2018     Discussed health benefits of physical activity, and encouraged him to engage in regular exercise appropriate for his age and condition.    1. Annual physical exam Normal physical exam today. Will check labs as below and f/u pending lab results. If labs are stable and WNL he will not  need to have these rechecked for one year at his next annual physical exam. He is to call the office in the meantime if he has any acute issue, questions or concerns. - CBC with Differential/Platelet - Comprehensive metabolic panel - Hemoglobin A1c - Lipid panel - TSH  2. Hypercholesterolemia with hypertriglyceridemia Stable on Pravastatin 20mg . Will check labs as below and f/u pending results. - CBC with Differential/Platelet - Comprehensive metabolic panel - Hemoglobin A1c - Lipid panel  3. Essential hypertension Stable on HCTZ 25mg . At home readings are between 120-130s/80s. Will check labs as below and f/u pending results. - CBC with Differential/Platelet - Comprehensive metabolic panel - Hemoglobin A1c - Lipid panel  4. Body mass index (BMI) of 36.0-36.9 in adult Counseled patient on healthy lifestyle modifications including dieting and exercise.  - CBC with Differential/Platelet - Comprehensive metabolic panel - Hemoglobin A1c - Lipid panel  5. Screening for HIV without presence of risk factors - HIV antibody (with reflex)  6. Need for Tdap vaccination Tdap Vaccine given to patient without complications. Patient sat for 15 minutes after administration and was tolerated well without adverse effects. - Tdap vaccine greater than or equal to 7yo IM  --------------------------------------------------------------------    Margaretann Loveless, PA-C  [facility]

## 2018-02-06 NOTE — Patient Instructions (Signed)

## 2018-02-07 ENCOUNTER — Telehealth: Payer: Self-pay

## 2018-02-07 LAB — LIPID PANEL
CHOLESTEROL TOTAL: 198 mg/dL (ref 100–199)
Chol/HDL Ratio: 6.2 ratio — ABNORMAL HIGH (ref 0.0–5.0)
HDL: 32 mg/dL — ABNORMAL LOW (ref >39)
Triglycerides: 477 mg/dL — ABNORMAL HIGH (ref 0–149)

## 2018-02-07 LAB — CBC WITH DIFFERENTIAL/PLATELET
BASOS: 0 %
Basophils Absolute: 0 10*3/uL (ref 0.0–0.2)
EOS (ABSOLUTE): 0 10*3/uL (ref 0.0–0.4)
Eos: 1 %
HEMOGLOBIN: 15 g/dL (ref 13.0–17.7)
Hematocrit: 43.2 % (ref 37.5–51.0)
IMMATURE GRANS (ABS): 0 10*3/uL (ref 0.0–0.1)
Immature Granulocytes: 0 %
LYMPHS: 24 %
Lymphocytes Absolute: 1.5 10*3/uL (ref 0.7–3.1)
MCH: 30.9 pg (ref 26.6–33.0)
MCHC: 34.7 g/dL (ref 31.5–35.7)
MCV: 89 fL (ref 79–97)
MONOCYTES: 8 %
Monocytes Absolute: 0.5 10*3/uL (ref 0.1–0.9)
NEUTROS ABS: 4.1 10*3/uL (ref 1.4–7.0)
Neutrophils: 67 %
Platelets: 249 10*3/uL (ref 150–450)
RBC: 4.85 x10E6/uL (ref 4.14–5.80)
RDW: 13 % (ref 12.3–15.4)
WBC: 6.2 10*3/uL (ref 3.4–10.8)

## 2018-02-07 LAB — COMPREHENSIVE METABOLIC PANEL
A/G RATIO: 1.8 (ref 1.2–2.2)
ALK PHOS: 86 IU/L (ref 39–117)
ALT: 64 IU/L — AB (ref 0–44)
AST: 32 IU/L (ref 0–40)
Albumin: 4.5 g/dL (ref 3.5–5.5)
BUN/Creatinine Ratio: 15 (ref 9–20)
BUN: 16 mg/dL (ref 6–20)
Bilirubin Total: 0.6 mg/dL (ref 0.0–1.2)
CALCIUM: 9.5 mg/dL (ref 8.7–10.2)
CHLORIDE: 98 mmol/L (ref 96–106)
CO2: 23 mmol/L (ref 20–29)
Creatinine, Ser: 1.1 mg/dL (ref 0.76–1.27)
GFR calc Af Amer: 103 mL/min/{1.73_m2} (ref >59)
GFR, EST NON AFRICAN AMERICAN: 89 mL/min/{1.73_m2} (ref >59)
Globulin, Total: 2.5 g/dL (ref 1.5–4.5)
Glucose: 103 mg/dL — ABNORMAL HIGH (ref 65–99)
POTASSIUM: 4 mmol/L (ref 3.5–5.2)
Sodium: 139 mmol/L (ref 134–144)
Total Protein: 7 g/dL (ref 6.0–8.5)

## 2018-02-07 LAB — HIV ANTIBODY (ROUTINE TESTING W REFLEX): HIV SCREEN 4TH GENERATION: NONREACTIVE

## 2018-02-07 LAB — TSH: TSH: 1.56 u[IU]/mL (ref 0.450–4.500)

## 2018-02-07 LAB — HEMOGLOBIN A1C
ESTIMATED AVERAGE GLUCOSE: 100 mg/dL
HEMOGLOBIN A1C: 5.1 % (ref 4.8–5.6)

## 2018-02-07 NOTE — Telephone Encounter (Signed)
Spoke with patient just wanted to double check if he was the same person in the [facility] to update the chart and vaccines.  Thanks,  -Joseline

## 2018-02-07 NOTE — Telephone Encounter (Signed)
Pt returned call. Please advise. Thanks TNP °

## 2018-02-07 NOTE — Telephone Encounter (Signed)
LMTCB

## 2018-02-08 ENCOUNTER — Encounter: Payer: Self-pay | Admitting: Physician Assistant

## 2018-02-08 DIAGNOSIS — E7800 Pure hypercholesterolemia, unspecified: Secondary | ICD-10-CM

## 2018-02-08 DIAGNOSIS — E781 Pure hyperglyceridemia: Secondary | ICD-10-CM

## 2018-02-09 ENCOUNTER — Telehealth: Payer: Self-pay

## 2018-02-09 MED ORDER — PRAVASTATIN SODIUM 40 MG PO TABS
40.0000 mg | ORAL_TABLET | Freq: Every day | ORAL | 1 refills | Status: DC
Start: 2018-02-09 — End: 2018-03-23

## 2018-02-09 NOTE — Telephone Encounter (Signed)
-----   Message from Margaretann LovelessJennifer M Burnette, [place]PA-C sent at 02/08/2018  5:20 PM EDT ----- Triglycerides have improved to 477 from 672 and total cholesterol has improved to 198, but the ALT (one liver enzyme) level has increased. Would recommend to limit fatty foods, alcohol and tylenol based products. Would recommend rechecking liver enzymes in 6-8 weeks after above lifestyle modifications. Would also recommend increasing pravastatin to 40mg  if agreeable. All other labs are unremarkable and stable.

## 2018-02-09 NOTE — Telephone Encounter (Signed)
LM that I saw that he viewed the message through my chart and I just wanted to see if he had any concerns.  Viewed by Katharina CaperNicholas Trueba on 02/08/2018 5:27 PM

## 2018-03-23 ENCOUNTER — Encounter: Payer: Self-pay | Admitting: Physician Assistant

## 2018-03-23 DIAGNOSIS — E781 Pure hyperglyceridemia: Secondary | ICD-10-CM

## 2018-03-23 DIAGNOSIS — E7800 Pure hypercholesterolemia, unspecified: Secondary | ICD-10-CM

## 2018-03-23 MED ORDER — PRAVASTATIN SODIUM 40 MG PO TABS: 40.0000 mg | ORAL_TABLET | Freq: Every day | ORAL | 1 refills | Status: DC

## 2018-04-22 ENCOUNTER — Ambulatory Visit (INDEPENDENT_AMBULATORY_CARE_PROVIDER_SITE_OTHER): Payer: 59

## 2018-04-22 DIAGNOSIS — Z23 Encounter for immunization: Secondary | ICD-10-CM

## 2018-08-01 ENCOUNTER — Other Ambulatory Visit: Payer: Self-pay | Admitting: Physician Assistant

## 2018-08-01 DIAGNOSIS — I10 Essential (primary) hypertension: Secondary | ICD-10-CM

## 2018-08-01 DIAGNOSIS — E7800 Pure hypercholesterolemia, unspecified: Secondary | ICD-10-CM

## 2018-08-01 DIAGNOSIS — E781 Pure hyperglyceridemia: Secondary | ICD-10-CM

## 2018-08-01 MED ORDER — PRAVASTATIN SODIUM 40 MG PO TABS: 40.0000 mg | ORAL_TABLET | Freq: Every day | ORAL | 1 refills | Status: DC

## 2018-08-01 MED ORDER — HYDROCHLOROTHIAZIDE 25 MG PO TABS: 25.0000 mg | ORAL_TABLET | Freq: Every day | ORAL | 1 refills | Status: DC

## 2018-08-01 NOTE — Telephone Encounter (Signed)
James Cooley Self [phone]   [facility] (card on file)  pravastatin (PRAVACHOL) 40 MG tablet / hydrochlorothiazide (HYDRODIURIL) 25 MG table  James Cooley stop by to bring new insurance cards and he needs a refill [facility]

## 2019-06-23 ENCOUNTER — Other Ambulatory Visit: Payer: Self-pay | Admitting: Physician Assistant

## 2019-06-23 DIAGNOSIS — I10 Essential (primary) hypertension: Secondary | ICD-10-CM

## 2019-06-23 DIAGNOSIS — E7800 Pure hypercholesterolemia, unspecified: Secondary | ICD-10-CM

## 2019-06-23 DIAGNOSIS — E781 Pure hyperglyceridemia: Secondary | ICD-10-CM

## 2019-06-24 NOTE — Telephone Encounter (Signed)
Requested medication (s) are due for refill today: yes  Requested medication (s) are on the active medication list: yes  Last refill:  11/11/2018  Future visit scheduled: no  Notes to clinic:  review for refill Overdue for office visit   Requested Prescriptions  Pending Prescriptions Disp Refills   hydrochlorothiazide (HYDRODIURIL) 25 MG tablet [Pharmacy Med Name: HYDROCHLOROT TAB 25MG ] 90 tablet 1    Sig: TAKE 1 TABLET DAILY      Cardiovascular: Diuretics - Thiazide Failed - 06/23/2019 10:14 PM      Failed - Ca in normal range and within 360 days    Calcium  Date Value Ref Range Status  02/06/2018 9.5 8.7 - 10.2 mg/dL Final          Failed - Cr in normal range and within 360 days    Creatinine, Ser  Date Value Ref Range Status  02/06/2018 1.10 0.76 - 1.27 mg/dL Final          Failed - K in normal range and within 360 days    Potassium  Date Value Ref Range Status  02/06/2018 4.0 3.5 - 5.2 mmol/L Final          Failed - Na in normal range and within 360 days    Sodium  Date Value Ref Range Status  02/06/2018 139 134 - 144 mmol/L Final          Failed - Last BP in normal range    BP Readings from Last 1 Encounters:  02/06/18 140/88          Failed - Valid encounter within last 6 months    Recent Outpatient Visits           1 year ago Annual physical exam   [facility] [place], [place], [place]   2 years ago Annual physical exam   [facility] [place], [place], [place]   3 years ago Essential hypertension   [facility] [place], [place], [place]   3 years ago Urticaria, acute   [facility] [place], [place], [place]   3 years ago Essential hypertension   [facility] M, PA-C                pravastatin (PRAVACHOL) 40 MG tablet [Pharmacy Med Name: PRAVASTATIN  TAB 40MG ] 90 tablet 1    Sig: TAKE 1 TABLET DAILY      Cardiovascular:  Antilipid - Statins  Failed - 06/23/2019 10:14 PM      Failed - Total Cholesterol in normal range and within 360 days    Cholesterol, Total  Date Value Ref Range Status  02/06/2018 198 100 - 199 mg/dL Final          Failed - LDL in normal range and within 360 days    LDL Calculated  Date Value Ref Range Status  02/06/2018 Comment 0 - 99 mg/dL Final    Comment:    Triglyceride result indicated is too high for an accurate LDL cholesterol estimation.           Failed - HDL in normal range and within 360 days    HDL  Date Value Ref Range Status  02/06/2018 32 (L) >39 mg/dL Final          Failed - Triglycerides in normal range and within 360 days    Triglycerides  Date Value Ref Range Status  02/06/2018 477 (H) 0 - 149 mg/dL Final  Failed - Valid encounter within last 12 months    Recent Outpatient Visits           1 year ago Annual physical exam   [facility] [place], [place], [place]   2 years ago Annual physical exam   [facility] [place], [place], [place]   3 years ago Essential hypertension   [place], [place], [place]   3 years ago Urticaria, acute   [facility], [place], [place]   3 years ago Essential hypertension   [facility] [place], [place]              Passed - Patient is not pregnant

## 2019-06-25 NOTE — Telephone Encounter (Signed)
Due for CPE

## 2019-06-26 NOTE — Telephone Encounter (Signed)
scheduled

## 2019-07-16 ENCOUNTER — Ambulatory Visit (INDEPENDENT_AMBULATORY_CARE_PROVIDER_SITE_OTHER): Payer: BC Managed Care – PPO | Admitting: Physician Assistant

## 2019-07-16 ENCOUNTER — Other Ambulatory Visit: Payer: Self-pay

## 2019-07-16 ENCOUNTER — Encounter: Payer: Self-pay | Admitting: Physician Assistant

## 2019-07-16 VITALS — BP 186/122 | HR 88 | Temp 97.3°F | Wt 302.0 lb

## 2019-07-16 DIAGNOSIS — E782 Mixed hyperlipidemia: Secondary | ICD-10-CM

## 2019-07-16 DIAGNOSIS — I10 Essential (primary) hypertension: Secondary | ICD-10-CM

## 2019-07-16 DIAGNOSIS — Z6838 Body mass index (BMI) 38.0-38.9, adult: Secondary | ICD-10-CM

## 2019-07-16 DIAGNOSIS — Z0000 Encounter for general adult medical examination without abnormal findings: Secondary | ICD-10-CM

## 2019-07-16 DIAGNOSIS — Z23 Encounter for immunization: Secondary | ICD-10-CM

## 2019-07-16 MED ORDER — AMLODIPINE BESYLATE 10 MG PO TABS: 10.0000 mg | ORAL_TABLET | Freq: Every day | ORAL | 0 refills | Status: DC

## 2019-07-16 NOTE — Progress Notes (Signed)
Patient: James Cooley, Male    DOB: 1986-04-14, 34 y.o.   MRN: [id number] Visit Date: 07/16/2019  Today's Provider: Mar Daring, PA-C   Chief Complaint  Patient presents with  . Annual Exam   Subjective:     Annual physical exam James Cooley is a 34 y.o. male who presents today for health maintenance and complete physical. He feels well. He reports exercising regularly. He reports he is sleeping well. -----------------------------------------------------------------   Review of Systems  Constitutional: Negative.   HENT: Negative.   Eyes: Negative.   Respiratory: Negative.   Cardiovascular: Negative.   Gastrointestinal: Negative.   Endocrine: Negative.   Musculoskeletal: Negative.   Skin: Negative.   Allergic/Immunologic: Negative.   Neurological: Negative.   Hematological: Negative.   Psychiatric/Behavioral: Negative.     Social History      He  reports that he has never smoked. He has never used smokeless tobacco. He reports current alcohol use. He reports that he does not use drugs.       Social History   Socioeconomic History  . Marital status: Married    Spouse name: Not on file  . Number of children: Not on file  . Years of education: Not on file  . Highest education level: Not on file  Occupational History  . Not on file  Tobacco Use  . Smoking status: Never Smoker  . Smokeless tobacco: Never Used  Substance and Sexual Activity  . Alcohol use: Yes    Comment: 1 per day  . Drug use: No  . Sexual activity: Yes  Other Topics Concern  . Not on file  Social History Narrative  . Not on file   Social Determinants of Health   Financial Resource Strain:   . Difficulty of Paying Living Expenses: Not on file  Food Insecurity:   . Worried About Charity fundraiser in the Last Year: Not on file  . Ran Out of Food in the Last Year: Not on file  Transportation Needs:   . Lack of Transportation (Medical): Not on file  . Lack of  Transportation (Non-Medical): Not on file  Physical Activity:   . Days of Exercise per Week: Not on file  . Minutes of Exercise per Session: Not on file  Stress:   . Feeling of Stress : Not on file  Social Connections:   . Frequency of Communication with Friends and Family: Not on file  . Frequency of Social Gatherings with Friends and Family: Not on file  . Attends Religious Services: Not on file  . Active Member of Clubs or Organizations: Not on file  . Attends Archivist Meetings: Not on file  . Marital Status: Not on file    Past Medical History:  Diagnosis Date  . Asthma      Patient Active Problem List   Diagnosis Date Noted  . Hypercholesterolemia with hypertriglyceridemia 02/11/2016  . Hypertension 05/21/2015  . Kidney stones 05/21/2015    Past Surgical History:  Procedure Laterality Date  . NO PAST SURGERIES      Family History        Family Status  Relation Name Status  . Mother  Deceased at age 26       breast cancer, pneumonia  . Father  Alive  . MGM  Alive  . PGM  (Not Specified)  . Brother  (Not Specified)  . MGF  Deceased        His family  history includes Breast cancer in his mother; Depression in his paternal grandmother; Diabetes in his maternal grandfather; Hypertension in his father; Lung cancer in his maternal grandfather; Mental illness in his paternal grandmother; Pneumonia in his mother; Sleep apnea in his brother and father.      Allergies  Allergen Reactions  . Amoxicillin Rash     Current Outpatient Medications:  Marland Kitchen  Glucosamine-Chondroitin (OSTEO BI-FLEX REGULAR STRENGTH PO), Take by mouth., Disp: , Rfl:  .  hydrochlorothiazide (HYDRODIURIL) 25 MG tablet, TAKE 1 TABLET DAILY, Disp: 90 tablet, Rfl: 0 .  Multiple Vitamin (MULTI-VITAMINS) TABS, Take by mouth., Disp: , Rfl:  .  Omega-3 Fatty Acids (FISH OIL OMEGA-3 PO), Take by mouth., Disp: , Rfl:  .  pravastatin (PRAVACHOL) 40 MG tablet, TAKE 1 TABLET DAILY, Disp: 90  tablet, Rfl: 0   Patient Care Team: Mar Daring, PA-C as PCP - General (Family Medicine)    Objective:    Vitals: BP (!) 186/122 (BP Location: Left Arm, Patient Position: Sitting, Cuff Size: Large)   Pulse 88   Temp (!) 97.3 F (36.3 C) (Temporal)   Wt (!) 302 lb (137 kg)   BMI 38.77 kg/m    Vitals:   07/16/19 1604 07/16/19 1615  BP: (!) 189/137 (!) 186/122  Pulse: 88   Temp: (!) 97.3 F (36.3 C)   TempSrc: Temporal   Weight: (!) 302 lb (137 kg)      Physical Exam   Depression Screen PHQ 2/9 Scores 07/16/2019 02/06/2018 03/29/2016  PHQ - 2 Score 0 0 0  PHQ- 9 Score 0 - -       Assessment & Plan:     Routine Health Maintenance and Physical Exam  Exercise Activities and Dietary recommendations Goals   None     Immunization History  Administered Date(s) Administered  . Hepatitis B 04/28/1998, 05/26/1998, 10/27/1998  . Influenza,inj,Quad PF,6+ Mos 05/21/2015, 04/22/2018  . MMR 11/27/2004  . Tdap 02/06/2018    Health Maintenance  Topic Date Due  . INFLUENZA VACCINE  02/10/2019  . TETANUS/TDAP  02/07/2028  . HIV Screening  Completed     Discussed health benefits of physical activity, and encouraged him to engage in regular exercise appropriate for his age and condition.    1. Annual physical exam Normal physical exam today. Will check labs as below and f/u pending lab results. If labs are stable and WNL he will not need to have these rechecked for one year at his next annual physical exam. He is to call the office in the meantime if he has any acute issue, questions or concerns. - TSH  2. Essential hypertension Elevated. Will start Amlodipine as below. Continue HCTZ. Advised to send BP through mychart (wife is Therapist, sports) in the next 2 weeks to make sure BP improving. Will check labs as below and f/u pending results.  - CBC w/Diff/Platelet - Comprehensive Metabolic Panel (CMET) - Lipid Profile - HgB A1c - amLODipine (NORVASC) 10 MG tablet; Take 1  tablet (10 mg total) by mouth daily.  Dispense: 90 tablet; Refill: 0  3. Class 2 severe obesity due to excess calories with serious comorbidity and body mass index (BMI) of 38.0 to 38.9 in adult [facility]) Counseled patient on healthy lifestyle modifications including dieting and exercise.  - CBC w/Diff/Platelet - Comprehensive Metabolic Panel (CMET) - Lipid Profile - HgB A1c  4. Hypercholesterolemia with hypertriglyceridemia Continue Pravastatin 36m. Will check labs as below and f/u pending results. - CBC w/Diff/Platelet - Comprehensive Metabolic  Panel (CMET) - Lipid Profile - HgB A1c  5. Need for influenza vaccination Flu vaccine given today without complication. Patient sat upright for 15 minutes to check for adverse reaction before being released. - Flu Vaccine QUAD 36+ mos PF IM (Fluarix & Fluzone Quad PF)  --------------------------------------------------------------------    Mar Daring, PA-C  [facility]

## 2019-07-16 NOTE — Patient Instructions (Addendum)
Health Maintenance, Male Adopting a healthy lifestyle and getting preventive care are important in promoting health and wellness. Ask your health care provider about:  The right schedule for you to have regular tests and exams.  Things you can do on your own to prevent diseases and keep yourself healthy. What should I know about diet, weight, and exercise? Eat a healthy diet   Eat a diet that includes plenty of vegetables, fruits, low-fat dairy products, and lean protein.  Do not eat a lot of foods that are high in solid fats, added sugars, or sodium. Maintain a healthy weight Body mass index (BMI) is a measurement that can be used to identify possible weight problems. It estimates body fat based on height and weight. Your health care provider can help determine your BMI and help you achieve or maintain a healthy weight. Get regular exercise Get regular exercise. This is one of the most important things you can do for your health. Most adults should:  Exercise for at least 150 minutes each week. The exercise should increase your heart rate and make you sweat (moderate-intensity exercise).  Do strengthening exercises at least twice a week. This is in addition to the moderate-intensity exercise.  Spend less time sitting. Even light physical activity can be beneficial. Watch cholesterol and blood lipids Have your blood tested for lipids and cholesterol at 33 years of age, then have this test every 5 years. You may need to have your cholesterol levels checked more often if:  Your lipid or cholesterol levels are high.  You are older than 33 years of age.  You are at high risk for heart disease. What should I know about cancer screening? Many types of cancers can be detected early and may often be prevented. Depending on your health history and family history, you may need to have cancer screening at various ages. This may include screening for:  Colorectal cancer.  Prostate  cancer.  Skin cancer.  Lung cancer. What should I know about heart disease, diabetes, and high blood pressure? Blood pressure and heart disease  High blood pressure causes heart disease and increases the risk of stroke. This is more likely to develop in people who have high blood pressure readings, are of African descent, or are overweight.  Talk with your health care provider about your target blood pressure readings.  Have your blood pressure checked: ? Every 3-5 years if you are 18-39 years of age. ? Every year if you are 40 years old or older.  If you are between the ages of 65 and 75 and are a current or former smoker, ask your health care provider if you should have a one-time screening for abdominal aortic aneurysm (AAA). Diabetes Have regular diabetes screenings. This checks your fasting blood sugar level. Have the screening done:  Once every three years after age 45 if you are at a normal weight and have a low risk for diabetes.  More often and at a younger age if you are overweight or have a high risk for diabetes. What should I know about preventing infection? Hepatitis B If you have a higher risk for hepatitis B, you should be screened for this virus. Talk with your health care provider to find out if you are at risk for hepatitis B infection. Hepatitis C Blood testing is recommended for:  Everyone born from 1945 through 1965.  Anyone with known risk factors for hepatitis C. Sexually transmitted infections (STIs)  You should be screened each year   for STIs, including gonorrhea and chlamydia, if: ? You are sexually active and are younger than 33 years of age. ? You are older than 33 years of age and your health care provider tells you that you are at risk for this type of infection. ? Your sexual activity has changed since you were last screened, and you are at increased risk for chlamydia or gonorrhea. Ask your health care provider if you are at risk.  Ask your  health care provider about whether you are at high risk for HIV. Your health care provider may recommend a prescription medicine to help prevent HIV infection. If you choose to take medicine to prevent HIV, you should first get tested for HIV. You should then be tested every 3 months for as long as you are taking the medicine. Follow these instructions at home: Lifestyle  Do not use any products that contain nicotine or tobacco, such as cigarettes, e-cigarettes, and chewing tobacco. If you need help quitting, ask your health care provider.  Do not use street drugs.  Do not share needles.  Ask your health care provider for help if you need support or information about quitting drugs. Alcohol use  Do not drink alcohol if your health care provider tells you not to drink.  If you drink alcohol: ? Limit how much you have to 0-2 drinks a day. ? Be aware of how much alcohol is in your drink. In the [place] one drink equals one 12 oz bottle of beer (355 mL), one 5 oz glass of wine (148 mL), or one 1 oz glass of hard liquor (44 mL). General instructions  Schedule regular health, dental, and eye exams.  Stay current with your vaccines.  Tell your health care provider if: ? You often feel depressed. ? You have ever been abused or do not feel safe at home. Summary  Adopting a healthy lifestyle and getting preventive care are important in promoting health and wellness.  Follow your health care provider's instructions about healthy diet, exercising, and getting tested or screened for diseases.  Follow your health care provider's instructions on monitoring your cholesterol and blood pressure. This information is not intended to replace advice given to you by your health care provider. Make sure you discuss any questions you have with your health care provider. Document Revised: 06/21/2018 Document Reviewed: 06/21/2018 Elsevier Patient Education  [place]. Amlodipine Oral Tablets What  is this medicine? AMLODIPINE (am LOE di peen) is a calcium channel blocker. It relaxes your blood vessels and decreases the amount of work the heart has to do. It treats high blood pressure and/or prevents chest pain (also called angina). This medicine may be used for other purposes; ask your health care provider or pharmacist if you have questions. COMMON BRAND NAME(S): Norvasc What should I tell my health care provider before I take this medicine? They need to know if you have any of these conditions:  heart disease  liver disease  an unusual or allergic reaction to amlodipine, other drugs, foods, dyes, or preservatives  pregnant or trying to get pregnant  breast-feeding How should I use this medicine? Take this drug by mouth. Take it as directed on the prescription label at the same time every day. You can take it with or without food. If it upsets your stomach, take it with food. Keep taking it unless your health care provider tells you to stop. Talk to your health care provider about the use of this drug in children.  While it may be prescribed for children as young as 6 for selected conditions, precautions do apply. Overdosage: If you think you have taken too much of this medicine contact a poison control center or emergency room at once. NOTE: This medicine is only for you. Do not share this medicine with others. What if I miss a dose? If you miss a dose, take it as soon as you can. If it is almost time for your next dose, take only that dose. Do not take double or extra doses. What may interact with this medicine? This medicine may interact with the following medications:  clarithromycin  cyclosporine  diltiazem  itraconazole  simvastatin  tacrolimus This list may not describe all possible interactions. Give your health care provider a list of all the medicines, herbs, non-prescription drugs, or dietary supplements you use. Also tell them if you smoke, drink alcohol, or  use illegal drugs. Some items may interact with your medicine. What should I watch for while using this medicine? Visit your health care provider for regular checks on your progress. Check your blood pressure as directed. Ask your health care provider what your blood pressure should be. Also, find out when you should contact him or her. Do not treat yourself for coughs, colds, or pain while you are using this drug without asking your health care provider for advice. Some drugs may increase your blood pressure. You may get drowsy or dizzy. Do not drive, use machinery, or do anything that needs mental alertness until you know how this drug affects you. Do not stand up or sit up quickly, especially if you are an older patient. This reduces the risk of dizzy or fainting spells. What side effects may I notice from receiving this medicine? Side effects that you should report to your doctor or health care provider as soon as possible:  allergic reactions (skin rash, itching or hives; swelling of the face, lips, or tongue)  heart attack (trouble breathing; pain or tightness in the chest, neck, back or arms; unusually weak or tired)  low blood pressure (dizziness; feeling faint or lightheaded, falls; unusually weak or tired) Side effects that usually do not require medical attention (report these to your doctor or health care provider if they continue or are bothersome):  facial flushing  nausea  palpitations  stomach pain  sudden weight gain  swelling of the ankles, feet, hands This list may not describe all possible side effects. Call your doctor for medical advice about side effects. You may report side effects to FDA at 1-800-FDA-1088. Where should I keep my medicine? Keep out of the reach of children and pets. Store at room temperature between 59 and 86 degrees F (15 and 30 degrees C). Protect from light and moisture. Keep the container tightly closed. Throw away any unused drug after the  expiration date. NOTE: This sheet is a summary. It may not cover all possible information. If you have questions about this medicine, talk to your doctor, pharmacist, or health care provider.  2020 Elsevier/Gold Standard (2019-04-03 19:39:45)

## 2019-07-25 DIAGNOSIS — Z0000 Encounter for general adult medical examination without abnormal findings: Secondary | ICD-10-CM | POA: Diagnosis not present

## 2019-07-25 DIAGNOSIS — Z6838 Body mass index (BMI) 38.0-38.9, adult: Secondary | ICD-10-CM | POA: Diagnosis not present

## 2019-07-25 DIAGNOSIS — E782 Mixed hyperlipidemia: Secondary | ICD-10-CM | POA: Diagnosis not present

## 2019-07-25 DIAGNOSIS — I10 Essential (primary) hypertension: Secondary | ICD-10-CM | POA: Diagnosis not present

## 2019-07-26 ENCOUNTER — Telehealth: Payer: Self-pay

## 2019-07-26 DIAGNOSIS — E782 Mixed hyperlipidemia: Secondary | ICD-10-CM

## 2019-07-26 LAB — HEMOGLOBIN A1C
Est. average glucose Bld gHb Est-mCnc: 105 mg/dL
Hgb A1c MFr Bld: 5.3 % (ref 4.8–5.6)

## 2019-07-26 LAB — CBC WITH DIFFERENTIAL/PLATELET
Basophils Absolute: 0 10*3/uL (ref 0.0–0.2)
Basos: 1 %
EOS (ABSOLUTE): 0.1 10*3/uL (ref 0.0–0.4)
Eos: 1 %
Hematocrit: 43.4 % (ref 37.5–51.0)
Hemoglobin: 15.2 g/dL (ref 13.0–17.7)
Immature Grans (Abs): 0 10*3/uL (ref 0.0–0.1)
Immature Granulocytes: 0 %
Lymphocytes Absolute: 1.8 10*3/uL (ref 0.7–3.1)
Lymphs: 23 %
MCH: 31.2 pg (ref 26.6–33.0)
MCHC: 35 g/dL (ref 31.5–35.7)
MCV: 89 fL (ref 79–97)
Monocytes Absolute: 0.7 10*3/uL (ref 0.1–0.9)
Monocytes: 9 %
Neutrophils Absolute: 5.2 10*3/uL (ref 1.4–7.0)
Neutrophils: 66 %
Platelets: 293 10*3/uL (ref 150–450)
RBC: 4.87 x10E6/uL (ref 4.14–5.80)
RDW: 12.1 % (ref 11.6–15.4)
WBC: 7.9 10*3/uL (ref 3.4–10.8)

## 2019-07-26 LAB — COMPREHENSIVE METABOLIC PANEL
ALT: 55 IU/L — ABNORMAL HIGH (ref 0–44)
AST: 30 IU/L (ref 0–40)
Albumin/Globulin Ratio: 2.1 (ref 1.2–2.2)
Albumin: 4.8 g/dL (ref 4.0–5.0)
Alkaline Phosphatase: 94 IU/L (ref 39–117)
BUN/Creatinine Ratio: 15 (ref 9–20)
BUN: 17 mg/dL (ref 6–20)
Bilirubin Total: 0.7 mg/dL (ref 0.0–1.2)
CO2: 24 mmol/L (ref 20–29)
Calcium: 9.6 mg/dL (ref 8.7–10.2)
Chloride: 97 mmol/L (ref 96–106)
Creatinine, Ser: 1.1 mg/dL (ref 0.76–1.27)
GFR calc Af Amer: 102 mL/min/{1.73_m2} (ref >59)
GFR calc non Af Amer: 88 mL/min/{1.73_m2} (ref >59)
Globulin, Total: 2.3 g/dL (ref 1.5–4.5)
Glucose: 115 mg/dL — ABNORMAL HIGH (ref 65–99)
Potassium: 3.7 mmol/L (ref 3.5–5.2)
Sodium: 138 mmol/L (ref 134–144)
Total Protein: 7.1 g/dL (ref 6.0–8.5)

## 2019-07-26 LAB — LIPID PANEL
Chol/HDL Ratio: 6.6 ratio — ABNORMAL HIGH (ref 0.0–5.0)
Cholesterol, Total: 197 mg/dL (ref 100–199)
HDL: 30 mg/dL — ABNORMAL LOW (ref >39)
LDL Chol Calc (NIH): 69 mg/dL (ref 0–99)
Triglycerides: 635 mg/dL (ref 0–149)
VLDL Cholesterol Cal: 98 mg/dL — ABNORMAL HIGH (ref 5–40)

## 2019-07-26 LAB — TSH: TSH: 1.56 u[IU]/mL (ref 0.450–4.500)

## 2019-07-26 MED ORDER — ICOSAPENT ETHYL 1 G PO CAPS: 2.0000 g | ORAL_CAPSULE | Freq: Two times a day (BID) | ORAL | 1 refills | Status: DC

## 2019-07-26 NOTE — Telephone Encounter (Signed)
Sent in

## 2019-07-26 NOTE — Telephone Encounter (Signed)
Pt advised.  He would like to try Vascepa if there is a generic version.  Please send to [facility].  Thanks,   -Vernona Rieger

## 2019-07-26 NOTE — Telephone Encounter (Signed)
-----   Message from Margaretann Loveless, [place] sent at 07/26/2019  7:36 AM EST ----- Blood count is normal. Kidney function is normal. Liver enzymes are stable, ALT has improved from 64 last year to 55 now. Sodium, potassium and calcium are normal. Thyroid is normal. Cholesterol is doing better, but triglycerides are still elevated. Could consider adding Vascepa if desired. A1c is normal at 5.3.

## 2019-07-27 ENCOUNTER — Encounter: Payer: Self-pay | Admitting: Physician Assistant

## 2019-07-27 DIAGNOSIS — E782 Mixed hyperlipidemia: Secondary | ICD-10-CM

## 2019-07-27 MED ORDER — OMEGA-3-ACID ETHYL ESTERS 1 G PO CAPS: 2.0000 g | ORAL_CAPSULE | Freq: Two times a day (BID) | ORAL | 0 refills | Status: DC

## 2019-08-03 ENCOUNTER — Encounter: Payer: Self-pay | Admitting: Physician Assistant

## 2019-08-14 ENCOUNTER — Ambulatory Visit: Payer: BC Managed Care – PPO | Attending: Internal Medicine

## 2019-08-14 DIAGNOSIS — Z20822 Contact with and (suspected) exposure to covid-19: Secondary | ICD-10-CM | POA: Diagnosis not present

## 2019-08-15 LAB — NOVEL CORONAVIRUS, NAA: SARS-CoV-2, NAA: NOT DETECTED

## 2019-08-21 ENCOUNTER — Ambulatory Visit: Payer: BC Managed Care – PPO | Attending: Internal Medicine

## 2019-08-21 DIAGNOSIS — Z20822 Contact with and (suspected) exposure to covid-19: Secondary | ICD-10-CM | POA: Diagnosis not present

## 2019-08-22 LAB — NOVEL CORONAVIRUS, NAA: SARS-CoV-2, NAA: NOT DETECTED

## 2019-09-17 ENCOUNTER — Ambulatory Visit: Payer: BC Managed Care – PPO | Attending: Internal Medicine

## 2019-09-17 DIAGNOSIS — Z23 Encounter for immunization: Secondary | ICD-10-CM

## 2019-09-17 NOTE — Progress Notes (Signed)
   Covid-19 Vaccination Clinic  Name:  James Cooley    MRN: [id number] DOB: 13-Aug-1985  09/17/2019  Mr. James Cooley was observed post Covid-19 immunization for 15 minutes without incident. He was provided with Vaccine Information Sheet and instruction to access the V-Safe system.   Mr. James Cooley was instructed to call 911 with any severe reactions post vaccine: Marland Kitchen Difficulty breathing  . Swelling of face and throat  . A fast heartbeat  . A bad rash all over body  . Dizziness and weakness   Immunizations Administered    Name Date Dose VIS Date Route   Pfizer COVID-19 Vaccine 09/17/2019  8:14 AM 0.3 mL 06/22/2019 Intramuscular   Manufacturer: [employer], [employer]   Lot: [id number]   NDC: [address]-3

## 2019-10-10 ENCOUNTER — Ambulatory Visit: Payer: BC Managed Care – PPO | Attending: Internal Medicine

## 2019-10-10 DIAGNOSIS — Z23 Encounter for immunization: Secondary | ICD-10-CM

## 2019-10-10 NOTE — Progress Notes (Signed)
   Covid-19 Vaccination Clinic  Name:  James Cooley    MRN: [id number] DOB: 28-Jul-1985  10/10/2019  [name] was observed post Covid-19 immunization for 15 minutes without incident. He was provided with Vaccine Information Sheet and instruction to access the V-Safe system.   [name] was instructed to call 911 with any severe reactions post vaccine: Marland Kitchen Difficulty breathing  . Swelling of face and throat  . A fast heartbeat  . A bad rash all over body  . Dizziness and weakness   Immunizations Administered    Name Date Dose VIS Date Route   Pfizer COVID-19 Vaccine 10/10/2019  8:37 AM 0.3 mL 06/22/2019 Intramuscular   Manufacturer: [employer], [employer]   Lot: [id number]   NDC: [address]-6

## 2019-10-15 ENCOUNTER — Other Ambulatory Visit: Payer: Self-pay | Admitting: Physician Assistant

## 2019-10-15 DIAGNOSIS — E7800 Pure hypercholesterolemia, unspecified: Secondary | ICD-10-CM

## 2019-10-15 DIAGNOSIS — E781 Pure hyperglyceridemia: Secondary | ICD-10-CM

## 2019-10-15 DIAGNOSIS — I10 Essential (primary) hypertension: Secondary | ICD-10-CM

## 2019-10-15 NOTE — Telephone Encounter (Signed)
Requested Prescriptions  Pending Prescriptions Disp Refills  . hydrochlorothiazide (HYDRODIURIL) 25 MG tablet [Pharmacy Med Name: HYDROCHLOROT TAB 25MG ] 90 tablet 0    Sig: TAKE 1 TABLET DAILY     Cardiovascular: Diuretics - Thiazide Failed - 10/15/2019  9:19 PM      Failed - Last BP in normal range    BP Readings from Last 1 Encounters:  07/16/19 (!) 186/122         Failed - Valid encounter within last 6 months    Recent Outpatient Visits          3 months ago Annual physical exam   [facility] [place], [place], [place]andra Bevels   1 year ago Annual physical exam   [facility] M, M   3 years ago Annual physical exam   [facility] M, M   3 years ago Essential hypertension   [facility] M, M   3 years ago Urticaria, acute   [facility] [place], [place], M             Passed - Ca in normal range and within 360 days    Calcium  Date Value Ref Range Status  07/25/2019 9.6 8.7 - 10.2 mg/dL Final         Passed - Cr in normal range and within 360 days    Creatinine, Ser  Date Value Ref Range Status  07/25/2019 1.10 0.76 - 1.27 mg/dL Final         Passed - K in normal range and within 360 days    Potassium  Date Value Ref Range Status  07/25/2019 3.7 3.5 - 5.2 mmol/L Final         Passed - Na in normal range and within 360 days    Sodium  Date Value Ref Range Status  07/25/2019 138 134 - 144 mmol/L Final         . pravastatin (PRAVACHOL) 40 MG tablet [Pharmacy Med Name: PRAVASTATIN  TAB 40MG ] 90 tablet 0    Sig: TAKE 1 TABLET DAILY     Cardiovascular:  Antilipid - Statins Failed - 10/15/2019  9:19 PM      Failed - LDL in normal range and within 360 days    LDL Chol Calc (NIH)  Date Value Ref Range Status  07/25/2019 69 0 - 99 mg/dL Final         Failed - HDL in normal range and within 360 days    HDL  Date Value Ref Range  Status  07/25/2019 30 (L) >39 mg/dL Final         Failed - Triglycerides in normal range and within 360 days    Triglycerides  Date Value Ref Range Status  07/25/2019 635 (HH) 0 - 149 mg/dL Final         Failed - Valid encounter within last 12 months    Recent Outpatient Visits          3 months ago Annual physical exam   [facility] [place], [facility], [place]   1 year ago Annual physical exam   [facility] [place], [facility], [place]   3 years ago Annual physical exam   [facility] [place], [facility], [place]   3 years ago Essential hypertension   [facility] [place], [place], [place]   3 years ago Urticaria, acute   [facility] [place], [place], [place]  Passed - Total Cholesterol in normal range and within 360 days    Cholesterol, Total  Date Value Ref Range Status  07/25/2019 197 100 - 199 mg/dL Final         Passed - Patient is not pregnant      . amLODipine (NORVASC) 10 MG tablet [Pharmacy Med Name: AMLODIPINE TAB 10MG ] 90 tablet 0    Sig: TAKE 1 TABLET DAILY     Cardiovascular:  Calcium Channel Blockers Failed - 10/15/2019  9:19 PM      Failed - Last BP in normal range    BP Readings from Last 1 Encounters:  07/16/19 (!) 186/122         Failed - Valid encounter within last 6 months    Recent Outpatient Visits          3 months ago Annual physical exam   [facility] [place], [place], [place]   1 year ago Annual physical exam   [place], [place], [place]   3 years ago Annual physical exam   [facility] [place], Clearnce Sorrel, [place]   3 years ago Essential hypertension   [place], [place], [place]   3 years ago Urticaria, acute   [place], [place]             NOTE:  Patient was seen in the past 3 months and had a complete physical.

## 2019-12-25 DIAGNOSIS — J069 Acute upper respiratory infection, unspecified: Secondary | ICD-10-CM | POA: Diagnosis not present

## 2020-02-20 DIAGNOSIS — Z03818 Encounter for observation for suspected exposure to other biological agents ruled out: Secondary | ICD-10-CM | POA: Diagnosis not present

## 2020-02-20 DIAGNOSIS — Z1152 Encounter for screening for COVID-19: Secondary | ICD-10-CM | POA: Diagnosis not present

## 2020-03-18 DIAGNOSIS — D2262 Melanocytic nevi of left upper limb, including shoulder: Secondary | ICD-10-CM | POA: Diagnosis not present

## 2020-03-18 DIAGNOSIS — D225 Melanocytic nevi of trunk: Secondary | ICD-10-CM | POA: Diagnosis not present

## 2020-03-18 DIAGNOSIS — L814 Other melanin hyperpigmentation: Secondary | ICD-10-CM | POA: Diagnosis not present

## 2020-03-18 DIAGNOSIS — D2261 Melanocytic nevi of right upper limb, including shoulder: Secondary | ICD-10-CM | POA: Diagnosis not present

## 2020-04-08 ENCOUNTER — Other Ambulatory Visit: Payer: Self-pay | Admitting: Physician Assistant

## 2020-04-08 DIAGNOSIS — I10 Essential (primary) hypertension: Secondary | ICD-10-CM

## 2020-04-08 DIAGNOSIS — E782 Mixed hyperlipidemia: Secondary | ICD-10-CM

## 2020-04-09 NOTE — Telephone Encounter (Signed)
Requested Prescriptions  Pending Prescriptions Disp Refills  . amLODipine (NORVASC) 10 MG tablet [Pharmacy Med Name: AMLODIPINE TAB 10MG ] 90 tablet 0    Sig: TAKE 1 TABLET DAILY     Cardiovascular:  Calcium Channel Blockers Failed - 04/08/2020  9:12 PM      Failed - Last BP in normal range    BP Readings from Last 1 Encounters:  07/16/19 (!) 186/122         Failed - Valid encounter within last 6 months    Recent Outpatient Visits          8 months ago Annual physical exam   [facility] [place], [place], [place]andra Bevels   2 years ago Annual physical exam   [facility] [place], [place], [place] Be[place]   3 years ago Annual physical exam   [facility] [place], [place], [place]andra Bevels   4 years ago Essential hypertension   [facility] M, M   4 years ago Urticaria, acute   [facility] [place], [place], M             . omega-3 acid ethyl esters (LOVAZA) 1 g capsule [Pharmacy Med Name: OMEGA-3-ACID CAP 1GM(L)] 360 capsule 0    Sig: TAKE 2 CAPSULES (2 GRAMS   TOTAL) TWO TIMES A DAY     Endocrinology:  Nutritional Agents Passed - 04/08/2020  9:12 PM      Passed - Valid encounter within last 12 months    Recent Outpatient Visits          8 months ago Annual physical exam   [facility] [place], [place], [place]andra Bevels   2 years ago Annual physical exam   [facility] [place], [place], [place]andra Bevels   3 years ago Annual physical exam   [facility] [place], [place], [place] Bevel[place]   4 years ago Essential hypertension   [facility] [place], [place], [place]   4 years ago Urticaria, acute   [facility] [place], [place], [place]

## 2020-04-09 NOTE — Telephone Encounter (Signed)
Requested Prescriptions  Pending Prescriptions Disp Refills  . amLODipine (NORVASC) 10 MG tablet [Pharmacy Med Name: AMLODIPINE TAB 10MG ] 30 tablet 0    Sig: TAKE 1 TABLET DAILY     Cardiovascular:  Calcium Channel Blockers Failed - 04/08/2020  9:12 PM      Failed - Last BP in normal range    BP Readings from Last 1 Encounters:  07/16/19 (!) 186/122         Failed - Valid encounter within last 6 months    Recent Outpatient Visits          8 months ago Annual physical exam   [facility] [place], [place], [place]andra Bevels   2 years ago Annual physical exam   [facility] [place], [place], [place] Bevels   3 years ago Annual physical exam   [facility] [place], [place], [place] Be[place]   4 years ago Essential hypertension   [facility] [place], [place], [place]andra Be[place]   4 years ago Urticaria, acute   [facility] [place], [place], [place]             Signed Prescriptions Disp Refills   omega-3 acid ethyl esters (LOVAZA) 1 g capsule 360 capsule 1    Sig: TAKE 2 CAPSULES (2 GRAMS   TOTAL) TWO TIMES A DAY     Endocrinology:  Nutritional Agents Passed - 04/08/2020  9:12 PM      Passed - Valid encounter within last 12 months    Recent Outpatient Visits          8 months ago Annual physical exam   [facility] [place], [place], [place]andra Bevel[place]   2 years ago Annual physical exam   [facility] [place], [place], [place]andra Bevels   3 years ago Annual physical exam   [facility] [place], [place], [place]andra Bevels   4 years ago Essential hypertension   [facility] [place], [place], [place]   4 years ago Urticaria, acute   [facility] [place], [place], [place]             Courtesy refill for a month supply for patient to schedule an appointment for follow-up.

## 2020-04-17 DIAGNOSIS — Z1152 Encounter for screening for COVID-19: Secondary | ICD-10-CM | POA: Diagnosis not present

## 2020-04-17 DIAGNOSIS — Z03818 Encounter for observation for suspected exposure to other biological agents ruled out: Secondary | ICD-10-CM | POA: Diagnosis not present

## 2020-04-21 ENCOUNTER — Telehealth (INDEPENDENT_AMBULATORY_CARE_PROVIDER_SITE_OTHER): Payer: BC Managed Care – PPO | Admitting: Physician Assistant

## 2020-04-21 ENCOUNTER — Encounter: Payer: Self-pay | Admitting: Physician Assistant

## 2020-04-21 DIAGNOSIS — R509 Fever, unspecified: Secondary | ICD-10-CM | POA: Diagnosis not present

## 2020-04-21 DIAGNOSIS — M791 Myalgia, unspecified site: Secondary | ICD-10-CM | POA: Diagnosis not present

## 2020-04-21 NOTE — Progress Notes (Signed)
MyChart Video Visit    Virtual Visit via Video Note   This visit type was conducted due to national recommendations for restrictions regarding the COVID-19 Pandemic (e.g. social distancing) in an effort to limit this patient's exposure and mitigate transmission in our community. This patient is at least at moderate risk for complications without adequate follow up. This format is felt to be most appropriate for this patient at this time. Physical exam was limited by quality of the video and audio technology used for the visit.   Patient location: Home Provider location: [facility]  I discussed the limitations of evaluation and management by telemedicine and the availability of in person appointments. The patient expressed understanding and agreed to proceed.  Patient: James Cooley   DOB: 12-24-1985   34 y.o. Male  MRN: [id number] Visit Date: 04/21/2020  Today's healthcare provider: Margaretann Loveless, PA-C   Chief Complaint  Patient presents with  . URI   Subjective    HPI  Patient with c/o URI symptoms. Reports that fever started on Thursday of 100.9 bu over the weekend it has been 99.0. He has been tested for COVID and it was negative. Associated symptoms sore throat, cough and congestion.  Patient Active Problem List   Diagnosis Date Noted  . Hypercholesterolemia with hypertriglyceridemia 02/11/2016  . Hypertension 05/21/2015  . Kidney stones 05/21/2015   Past Medical History:  Diagnosis Date  . Asthma       Medications: Outpatient Medications Prior to Visit  Medication Sig  . amLODipine (NORVASC) 10 MG tablet TAKE 1 TABLET DAILY  . hydrochlorothiazide (HYDRODIURIL) 25 MG tablet TAKE 1 TABLET DAILY  . Multiple Vitamin (MULTI-VITAMINS) TABS Take by mouth.  . omega-3 acid ethyl esters (LOVAZA) 1 g capsule TAKE 2 CAPSULES (2 GRAMS   TOTAL) TWO TIMES A DAY  . pravastatin (PRAVACHOL) 40 MG tablet TAKE 1 TABLET DAILY  . Glucosamine-Chondroitin (OSTEO BI-FLEX REGULAR  STRENGTH PO) Take by mouth. (Patient not taking: Reported on 04/21/2020)  . Omega-3 Fatty Acids (FISH OIL OMEGA-3 PO) Take by mouth.   No facility-administered medications prior to visit.    Review of Systems  Constitutional: Positive for fatigue and fever.  HENT: Positive for congestion, ear pain, postnasal drip, sinus pain and sore throat.   Respiratory: Positive for cough. Negative for chest tightness, shortness of breath and wheezing.   Cardiovascular: Negative for chest pain, palpitations and leg swelling.  Gastrointestinal: Negative for abdominal pain and nausea.  Neurological: Positive for headaches. Negative for dizziness.    Last CBC Lab Results  Component Value Date   WBC 7.9 07/25/2019   HGB 15.2 07/25/2019   HCT 43.4 07/25/2019   MCV 89 07/25/2019   MCH 31.2 07/25/2019   RDW 12.1 07/25/2019   PLT 293 07/25/2019   Last metabolic panel Lab Results  Component Value Date   GLUCOSE 115 (H) 07/25/2019   NA 138 07/25/2019   K 3.7 07/25/2019   CL 97 07/25/2019   CO2 24 07/25/2019   BUN 17 07/25/2019   CREATININE 1.10 07/25/2019   GFRNONAA 88 07/25/2019   GFRAA 102 07/25/2019   CALCIUM 9.6 07/25/2019   PROT 7.1 07/25/2019   ALBUMIN 4.8 07/25/2019   LABGLOB 2.3 07/25/2019   AGRATIO 2.1 07/25/2019   BILITOT 0.7 07/25/2019   ALKPHOS 94 07/25/2019   AST 30 07/25/2019   ALT 55 (H) 07/25/2019      Objective    There were no vitals taken for this visit. Wt Readings from  Last 3 Encounters:  07/16/19 (!) 302 lb (137 kg)  02/06/18 282 lb 9.6 oz (128.2 kg)  08/18/16 263 lb (119.3 kg)      Physical Exam Vitals reviewed.  Constitutional:      General: He is not in acute distress.    Appearance: Normal appearance. He is well-developed. He is ill-appearing.  HENT:     Head: Normocephalic and atraumatic.  Eyes:     Conjunctiva/sclera: Conjunctivae normal.  Pulmonary:     Effort: Pulmonary effort is normal. No respiratory distress.  Musculoskeletal:      Cervical back: Normal range of motion and neck supple.  Neurological:     Mental Status: He is alert.  Psychiatric:        Behavior: Behavior normal.        Thought Content: Thought content normal.        Judgment: Judgment normal.        Assessment & Plan     1. Fever, unspecified fever cause Will test as below for flu, covid, and RSV. Continue symptomatic management. Push fluids. Rest as needed. - COVID-19, Flu A+B and RSV  2. Myalgia See above medical treatment plan. - COVID-19, Flu A+B and RSV   No follow-ups on file.     I discussed the assessment and treatment plan with the patient. The patient was provided an opportunity to ask questions and all were answered. The patient agreed with the plan and demonstrated an understanding of the instructions.   The patient was advised to call back or seek an in-person evaluation if the symptoms worsen or if the condition fails to improve as anticipated.  I provided 12 minutes of face-to-face time during this encounter via MyChart Video enabled encounter.  Delmer Islam, PA-C, have reviewed all documentation for this visit. The documentation on 04/22/20 for the exam, diagnosis, procedures, and orders are all accurate and complete.  Reine Just [facility] [phone] (phone) [phone] (fax)  [facility]

## 2020-04-22 ENCOUNTER — Encounter: Payer: Self-pay | Admitting: Physician Assistant

## 2020-04-23 ENCOUNTER — Encounter: Payer: Self-pay | Admitting: Physician Assistant

## 2020-04-23 ENCOUNTER — Other Ambulatory Visit: Payer: Self-pay | Admitting: Physician Assistant

## 2020-04-23 DIAGNOSIS — J0140 Acute pansinusitis, unspecified: Secondary | ICD-10-CM

## 2020-04-23 LAB — COVID-19, FLU A+B AND RSV
Influenza A, NAA: NOT DETECTED
Influenza B, NAA: NOT DETECTED
RSV, NAA: NOT DETECTED
SARS-CoV-2, NAA: NOT DETECTED

## 2020-04-23 LAB — SPECIMEN STATUS REPORT

## 2020-04-23 MED ORDER — DOXYCYCLINE HYCLATE 100 MG PO TABS: 100.0000 mg | ORAL_TABLET | Freq: Two times a day (BID) | ORAL | 0 refills | Status: DC

## 2020-04-23 NOTE — Progress Notes (Signed)
Doxycycline sent for sinusitis

## 2020-06-24 ENCOUNTER — Encounter: Payer: Self-pay | Admitting: Physician Assistant

## 2020-06-24 DIAGNOSIS — M5451 Vertebrogenic low back pain: Secondary | ICD-10-CM | POA: Diagnosis not present

## 2020-06-24 DIAGNOSIS — M9903 Segmental and somatic dysfunction of lumbar region: Secondary | ICD-10-CM | POA: Diagnosis not present

## 2020-06-24 DIAGNOSIS — R059 Cough, unspecified: Secondary | ICD-10-CM

## 2020-06-25 ENCOUNTER — Telehealth (INDEPENDENT_AMBULATORY_CARE_PROVIDER_SITE_OTHER): Payer: BC Managed Care – PPO | Admitting: Physician Assistant

## 2020-06-25 ENCOUNTER — Encounter: Payer: Self-pay | Admitting: Physician Assistant

## 2020-06-25 DIAGNOSIS — J189 Pneumonia, unspecified organism: Secondary | ICD-10-CM | POA: Diagnosis not present

## 2020-06-25 MED ORDER — ALBUTEROL SULFATE HFA 108 (90 BASE) MCG/ACT IN AERS: 2.0000 | INHALATION_SPRAY | Freq: Four times a day (QID) | RESPIRATORY_TRACT | 0 refills | Status: DC | PRN

## 2020-06-25 MED ORDER — PREDNISONE 10 MG (21) PO TBPK: ORAL_TABLET | ORAL | 0 refills | Status: DC

## 2020-06-25 MED ORDER — BENZONATATE 200 MG PO CAPS: 200.0000 mg | ORAL_CAPSULE | Freq: Three times a day (TID) | ORAL | 0 refills | Status: DC | PRN

## 2020-06-25 MED ORDER — AZITHROMYCIN 250 MG PO TABS: ORAL_TABLET | ORAL | 0 refills | Status: DC

## 2020-06-25 NOTE — Progress Notes (Signed)
Virtual telephone visit    Virtual Visit via Telephone Note   This visit type was conducted due to national recommendations for restrictions regarding the COVID-19 Pandemic (e.g. social distancing) in an effort to limit this patient's exposure and mitigate transmission in our community. Due to his co-morbid illnesses, this patient is at least at moderate risk for complications without adequate follow up. This format is felt to be most appropriate for this patient at this time. The patient did not have access to video technology or had technical difficulties with video requiring transitioning to audio format only (telephone). Physical exam was limited to content and character of the telephone converstion.    Patient location: Home Provider location: [facility]  I discussed the limitations of evaluation and management by telemedicine and the availability of in person appointments. The patient expressed understanding and agreed to proceed.   Visit Date: 06/25/2020  Today's healthcare provider: Margaretann Loveless, PA-C   No chief complaint on file.  Subjective    URI  This is a new problem. The current episode started 1 to 4 weeks ago (2 weeks ago). The problem has been gradually worsening (worse in the mornings). The maximum temperature recorded prior to his arrival was 100.4 - 100.9 F (fever on Sunday). The fever has been present for less than 1 day. Associated symptoms include chest pain, congestion, coughing and wheezing. Pertinent negatives include no abdominal pain, ear pain, headaches, nausea, plugged ear sensation, rhinorrhea, sinus pain, sneezing, sore throat or swollen glands. He has tried decongestant (mucinex) for the symptoms. The treatment provided no relief.     Patient Active Problem List   Diagnosis Date Noted  . Hypercholesterolemia with hypertriglyceridemia 02/11/2016  . Hypertension 05/21/2015  . Kidney stones 05/21/2015   Past Medical History:  Diagnosis Date  .  Asthma       Medications: Outpatient Medications Prior to Visit  Medication Sig  . amLODipine (NORVASC) 10 MG tablet TAKE 1 TABLET DAILY  . doxycycline (VIBRA-TABS) 100 MG tablet Take 1 tablet (100 mg total) by mouth 2 (two) times daily.  . Glucosamine-Chondroitin (OSTEO BI-FLEX REGULAR STRENGTH PO) Take by mouth. (Patient not taking: Reported on 04/21/2020)  . hydrochlorothiazide (HYDRODIURIL) 25 MG tablet TAKE 1 TABLET DAILY  . Multiple Vitamin (MULTI-VITAMINS) TABS Take by mouth.  . omega-3 acid ethyl esters (LOVAZA) 1 g capsule TAKE 2 CAPSULES (2 GRAMS   TOTAL) TWO TIMES A DAY  . Omega-3 Fatty Acids (FISH OIL OMEGA-3 PO) Take by mouth.  . pravastatin (PRAVACHOL) 40 MG tablet TAKE 1 TABLET DAILY   No facility-administered medications prior to visit.    Review of Systems  Constitutional: Negative for fatigue and fever.  HENT: Positive for congestion. Negative for ear pain, rhinorrhea, sinus pain, sneezing and sore throat.   Respiratory: Positive for cough, chest tightness and wheezing.   Cardiovascular: Positive for chest pain. Negative for palpitations and leg swelling.  Gastrointestinal: Negative for abdominal pain and nausea.  Neurological: Negative for dizziness and headaches.    Last CBC Lab Results  Component Value Date   WBC 7.9 07/25/2019   HGB 15.2 07/25/2019   HCT 43.4 07/25/2019   MCV 89 07/25/2019   MCH 31.2 07/25/2019   RDW 12.1 07/25/2019   PLT 293 07/25/2019   Last metabolic panel Lab Results  Component Value Date   GLUCOSE 115 (H) 07/25/2019   NA 138 07/25/2019   K 3.7 07/25/2019   CL 97 07/25/2019   CO2 24 07/25/2019  BUN 17 07/25/2019   CREATININE 1.10 07/25/2019   GFRNONAA 88 07/25/2019   GFRAA 102 07/25/2019   CALCIUM 9.6 07/25/2019   PROT 7.1 07/25/2019   ALBUMIN 4.8 07/25/2019   LABGLOB 2.3 07/25/2019   AGRATIO 2.1 07/25/2019   BILITOT 0.7 07/25/2019   ALKPHOS 94 07/25/2019   AST 30 07/25/2019   ALT 55 (H) 07/25/2019       Objective    There were no vitals taken for this visit. BP Readings from Last 3 Encounters:  07/16/19 (!) 186/122  02/06/18 140/88  08/18/16 120/70   Wt Readings from Last 3 Encounters:  07/16/19 (!) 302 lb (137 kg)  02/06/18 282 lb 9.6 oz (128.2 kg)  08/18/16 263 lb (119.3 kg)        Assessment & Plan     1. Atypical pneumonia Worsening. Will treat with zpak, albuterol, prednisone and tessalon perles. Push fluids. Rest. Call if worsening.  - azithromycin (ZITHROMAX) 250 MG tablet; Take 2 tablets PO on day one, and one tablet PO daily thereafter until completed.  Dispense: 6 tablet; Refill: 0 - predniSONE (STERAPRED UNI-PAK 21 TAB) 10 MG (21) TBPK tablet; 6 day taper; take as directed on package instructions  Dispense: 21 tablet; Refill: 0 - benzonatate (TESSALON) 200 MG capsule; Take 1 capsule (200 mg total) by mouth 3 (three) times daily as needed.  Dispense: 30 capsule; Refill: 0 - albuterol (VENTOLIN HFA) 108 (90 Base) MCG/ACT inhaler; Inhale 2 puffs into the lungs every 6 (six) hours as needed for wheezing or shortness of breath.  Dispense: 8 g; Refill: 0   No follow-ups on file.    I discussed the assessment and treatment plan with the patient. The patient was provided an opportunity to ask questions and all were answered. The patient agreed with the plan and demonstrated an understanding of the instructions.   The patient was advised to call back or seek an in-person evaluation if the symptoms worsen or if the condition fails to improve as anticipated.  I provided 12 minutes of non-face-to-face time during this encounter.  Delmer Islam, PA-C, have reviewed all documentation for this visit. The documentation on 06/25/20 for the exam, diagnosis, procedures, and orders are all accurate and complete.  Reine Just [facility] [phone] (phone) [phone] (fax)  [facility]

## 2020-06-25 NOTE — Patient Instructions (Signed)
Community-Acquired Pneumonia, Adult Pneumonia is an infection of the lungs. It causes swelling in the airways of the lungs. Mucus and fluid may also build up inside the airways. One type of pneumonia can happen while a person is in a hospital. A different type can happen when a person is not in a hospital (community-acquired pneumonia).  What are the causes?  This condition is caused by germs (viruses, bacteria, or fungi). Some types of germs can be passed from one person to another. This can happen when you breathe in droplets from the cough or sneeze of an infected person. What increases the risk? You are more likely to develop this condition if you:  Have a long-term (chronic) disease, such as: ? Chronic obstructive pulmonary disease (COPD). ? Asthma. ? Cystic fibrosis. ? Congestive heart failure. ? Diabetes. ? Kidney disease.  Have HIV.  Have sickle cell disease.  Have had your spleen removed.  Do not take good care of your teeth and mouth (poor dental hygiene).  Have a medical condition that increases the risk of breathing in droplets from your own mouth and nose.  Have a weakened body defense system (immune system).  Are a smoker.  Travel to areas where the germs that cause this illness are common.  Are around certain animals or the places they live. What are the signs or symptoms?  A dry cough.  A wet (productive) cough.  Fever.  Sweating.  Chest pain. This often happens when breathing deeply or coughing.  Fast breathing or trouble breathing.  Shortness of breath.  Shaking chills.  Feeling tired (fatigue).  Muscle aches. How is this treated? Treatment for this condition depends on many things. Most adults can be treated at home. In some cases, treatment must happen in a hospital. Treatment may include:  Medicines given by mouth or through an IV tube.  Being given extra oxygen.  Respiratory therapy. In rare cases, treatment for very bad pneumonia  may include:  Using a machine to help you breathe.  Having a procedure to remove fluid from around your lungs. Follow these instructions at home: Medicines  Take over-the-counter and prescription medicines only as told by your doctor. ? Only take cough medicine if you are losing sleep.  If you were prescribed an antibiotic medicine, take it as told by your doctor. Do not stop taking the antibiotic even if you start to feel better. General instructions   Sleep with your head and neck raised (elevated). You can do this by sleeping in a recliner or by putting a few pillows under your head.  Rest as needed. Get at least 8 hours of sleep each night.  Drink enough water to keep your pee (urine) pale yellow.  Eat a healthy diet that includes plenty of vegetables, fruits, whole grains, low-fat dairy products, and lean protein.  Do not use any products that contain nicotine or tobacco. These include cigarettes, e-cigarettes, and chewing tobacco. If you need help quitting, ask your doctor.  Keep all follow-up visits as told by your doctor. This is important. How is this prevented? A shot (vaccine) can help prevent pneumonia. Shots are often suggested for:  People older than 33 years of age.  People older than 33 years of age who: ? Are having cancer treatment. ? Have long-term (chronic) lung disease. ? Have problems with their body's defense system. You may also prevent pneumonia if you take these actions:  Get the flu (influenza) shot every year.  Go to the dentist as   often as told.  Wash your hands often. If you cannot use soap and water, use hand sanitizer. Contact a doctor if:  You have a fever.  You lose sleep because your cough medicine does not help. Get help right away if:  You are short of breath and it gets worse.  You have more chest pain.  Your sickness gets worse. This is very serious if: ? You are an older adult. ? Your body's defense system is weak.  You  cough up blood. Summary  Pneumonia is an infection of the lungs.  Most adults can be treated at home. Some will need treatment in a hospital.  Drink enough water to keep your pee pale yellow.  Get at least 8 hours of sleep each night. This information is not intended to replace advice given to you by your health care provider. Make sure you discuss any questions you have with your health care provider. Document Revised: 10/18/2018 Document Reviewed: 02/23/2018 Elsevier Patient Education  2020 [employer]  

## 2020-07-16 ENCOUNTER — Ambulatory Visit (INDEPENDENT_AMBULATORY_CARE_PROVIDER_SITE_OTHER): Payer: BC Managed Care – PPO

## 2020-07-16 ENCOUNTER — Other Ambulatory Visit: Payer: Self-pay

## 2020-07-16 ENCOUNTER — Ambulatory Visit
Admission: RE | Admit: 2020-07-16 | Discharge: 2020-07-16 | Disposition: A | Discharge status: Home / Self Care | Payer: BC Managed Care – PPO | Source: Ambulatory Visit

## 2020-07-16 ENCOUNTER — Ambulatory Visit
Admission: EM | Admit: 2020-07-16 | Discharge: 2020-07-16 | Disposition: A | Discharge status: Home / Self Care | Payer: BC Managed Care – PPO | Attending: Family Medicine | Admitting: Family Medicine

## 2020-07-16 DIAGNOSIS — R0602 Shortness of breath: Secondary | ICD-10-CM

## 2020-07-16 DIAGNOSIS — R059 Cough, unspecified: Secondary | ICD-10-CM | POA: Diagnosis not present

## 2020-07-16 DIAGNOSIS — Z20822 Contact with and (suspected) exposure to covid-19: Secondary | ICD-10-CM | POA: Diagnosis not present

## 2020-07-16 MED ORDER — PREDNISONE 10 MG PO TABS: 40.0000 mg | ORAL_TABLET | Freq: Every day | ORAL | 0 refills | Status: AC

## 2020-07-16 NOTE — Addendum Note (Signed)
Addended by: Margaretann Loveless on: 07/16/2020 11:05 AM   Modules accepted: Orders

## 2020-07-16 NOTE — Discharge Instructions (Addendum)
Your x ray was normal. Most likely some mucous or inflammation in the lungs heard on exam.  We will have you start mucinex Trying another round of prednisone  Albuterol as needed for cough, wheezing and SOB.  Follow up as needed for continued or worsening symptoms

## 2020-07-16 NOTE — ED Triage Notes (Signed)
Pt presents with c/o cough that first developed 12/15, completed z pack and prednisone , had some improvement then became worse again

## 2020-07-17 NOTE — ED Provider Notes (Signed)
Renaldo Fiddler    CSN: [id number] Arrival date & time: 07/16/20  1100      History   Chief Complaint Chief Complaint  Patient presents with  . Cough    HPI James Cooley is a 35 y.o. male.   Patient is a 35 year old male who presents today with complaints of cough.  This developed on 12/15.  He completed Z-Pak and prednisone and had some improvement then became worse again.  Reporting his family has had off and on viral illnesses.  No specific chest pain or shortness of breath.  No fever, chills     Past Medical History:  Diagnosis Date  . Asthma     Patient Active Problem List   Diagnosis Date Noted  . Hypercholesterolemia with hypertriglyceridemia 02/11/2016  . Hypertension 05/21/2015  . Kidney stones 05/21/2015    Past Surgical History:  Procedure Laterality Date  . NO PAST SURGERIES         Home Medications    Prior to Admission medications   Medication Sig Start Date End Date Taking? Authorizing Provider  predniSONE (DELTASONE) 10 MG tablet Take 4 tablets (40 mg total) by mouth daily for 5 days. 07/16/20 07/21/20 Yes [place] Crance A, NP  albuterol (VENTOLIN HFA) 108 (90 Base) MCG/ACT inhaler Inhale 2 puffs into the lungs every 6 (six) hours as needed for wheezing or shortness of breath. 06/25/20   Margaretann Loveless, PA-C  amLODipine (NORVASC) 10 MG tablet TAKE 1 TABLET DAILY 04/09/20   Margaretann Loveless, PA-C  benzonatate (TESSALON) 200 MG capsule Take 1 capsule (200 mg total) by mouth 3 (three) times daily as needed. 06/25/20   Margaretann Loveless, PA-C  Glucosamine-Chondroitin (OSTEO BI-FLEX REGULAR STRENGTH PO) Take by mouth. Patient not taking: Reported on 04/21/2020    [provider]  hydrochlorothiazide (HYDRODIURIL) 25 MG tablet TAKE 1 TABLET DAILY 10/15/19   Margaretann Loveless, PA-C  Multiple Vitamin (MULTI-VITAMINS) TABS Take by mouth.    [provider]  omega-3 acid ethyl esters (LOVAZA) 1 g capsule TAKE 2 CAPSULES  (2 GRAMS   TOTAL) TWO TIMES A DAY 04/09/20   Margaretann Loveless, PA-C  Omega-3 Fatty Acids (FISH OIL OMEGA-3 PO) Take by mouth.    [provider]  pravastatin (PRAVACHOL) 40 MG tablet TAKE 1 TABLET DAILY 10/15/19   Margaretann Loveless, PA-C    Family History Family History  Problem Relation Age of Onset  . Breast cancer Mother   . Pneumonia Mother   . Hypertension Father   . Sleep apnea Father   . Depression Paternal Grandmother   . Mental illness Paternal Grandmother   . Sleep apnea Brother   . Diabetes Maternal Grandfather   . Lung cancer Maternal Grandfather     Social History Social History   Tobacco Use  . Smoking status: Never Smoker  . Smokeless tobacco: Never Used  Vaping Use  . Vaping Use: Never used  Substance Use Topics  . Alcohol use: Yes    Comment: 1 per day  . Drug use: No     Allergies   Amoxicillin   Review of Systems Review of Systems   Physical Exam Triage Vital Signs ED Triage Vitals [07/16/20 1121]  Enc Vitals Group     BP (!) 160/115     Pulse Rate 93     Resp 18     Temp 98.3 F (36.8 C)     Temp Source Oral     SpO2 96 %  Weight      Height      Head Circumference      Peak Flow      Pain Score      Pain Loc      Pain Edu?      Excl. in GC?    No data found.  Updated Vital Signs BP (!) 160/115 (BP Location: Left Arm)   Pulse 93   Temp 98.3 F (36.8 C) (Oral)   Resp 18   SpO2 96%   Visual Acuity Right Eye Distance:   Left Eye Distance:   Bilateral Distance:    Right Eye Near:   Left Eye Near:    Bilateral Near:     Physical Exam Vitals and nursing note reviewed.  Constitutional:      General: He is not in acute distress.    Appearance: Normal appearance. He is not ill-appearing, toxic-appearing or diaphoretic.  HENT:     Head: Normocephalic and atraumatic.     Right Ear: Tympanic membrane and ear canal normal.     Left Ear: Tympanic membrane and ear canal normal.     Nose: Nose normal.      Mouth/Throat:     Pharynx: Oropharynx is clear.  Eyes:     Conjunctiva/sclera: Conjunctivae normal.  Cardiovascular:     Rate and Rhythm: Normal rate and regular rhythm.  Pulmonary:     Effort: Pulmonary effort is normal.     Breath sounds: Rhonchi present.     Comments: Rhonchi to left lower lung Musculoskeletal:        General: Normal range of motion.     Cervical back: Normal range of motion.  Skin:    General: Skin is warm and dry.  Neurological:     Mental Status: He is alert.  Psychiatric:        Mood and Affect: Mood normal.      UC Treatments / Results  Labs (all labs ordered are listed, but only abnormal results are displayed) Labs Reviewed  NOVEL CORONAVIRUS, NAA    EKG   Radiology DG Chest 2 View  Result Date: 07/16/2020 CLINICAL DATA:  35 year old male with cough and shortness of breath EXAM: CHEST - 2 VIEW COMPARISON:  None. FINDINGS: The cardiomediastinal silhouette is unremarkable. There is no evidence of focal airspace disease, pulmonary edema, suspicious pulmonary nodule/mass, pleural effusion, or pneumothorax. No acute bony abnormalities are identified. IMPRESSION: No active cardiopulmonary disease. Electronically Signed   By: Harmon Pier M.D.   On: 07/16/2020 12:21    Procedures Procedures (including critical care time)  Medications Ordered in UC Medications - No data to display  Initial Impression / Assessment and Plan / UC Course  I have reviewed the triage vital signs and the nursing notes.  Pertinent labs & imaging results that were available during my care of the patient were reviewed by me and considered in my medical decision making (see chart for details).     Shortness of breath X-ray without any acute findings.  Most likely symptoms are due to manipulation and mucus buildup. Recommended start Mucinex for cough, congestion and to thin mucus. Prednisone burst over the next 5 days for lung inflammation. Albuterol as needed for cough,  wheezing or shortness of breath. Follow up as needed for continued or worsening symptoms  Final Clinical Impressions(s) / UC Diagnoses   Final diagnoses:  SOB (shortness of breath)     Discharge Instructions     Your x ray was normal. Most  likely some mucous or inflammation in the lungs heard on exam.  We will have you start mucinex Trying another round of prednisone  Albuterol as needed for cough, wheezing and SOB.  Follow up as needed for continued or worsening symptoms     ED Prescriptions    Medication Sig Dispense Auth. Provider   predniSONE (DELTASONE) 10 MG tablet Take 4 tablets (40 mg total) by mouth daily for 5 days. 20 tablet Loura Halt A, NP     PDMP not reviewed this encounter.   Orvan July, NP 07/17/20 [phone]

## 2020-07-18 LAB — SARS-COV-2, NAA 2 DAY TAT

## 2020-07-18 LAB — NOVEL CORONAVIRUS, NAA: SARS-CoV-2, NAA: NOT DETECTED

## 2020-07-19 ENCOUNTER — Other Ambulatory Visit: Payer: Self-pay | Admitting: Physician Assistant

## 2020-07-19 DIAGNOSIS — J189 Pneumonia, unspecified organism: Secondary | ICD-10-CM

## 2020-07-20 ENCOUNTER — Other Ambulatory Visit: Payer: Self-pay | Admitting: Physician Assistant

## 2020-07-20 ENCOUNTER — Encounter: Payer: Self-pay | Admitting: Physician Assistant

## 2020-07-20 DIAGNOSIS — I10 Essential (primary) hypertension: Secondary | ICD-10-CM

## 2020-10-18 ENCOUNTER — Other Ambulatory Visit: Payer: Self-pay | Admitting: Physician Assistant

## 2020-10-18 DIAGNOSIS — I10 Essential (primary) hypertension: Secondary | ICD-10-CM

## 2020-10-18 NOTE — Telephone Encounter (Signed)
Overdue for appt. MyChart message sent to pt to call office and make appointment. Overdue for lab work also.  Amlodipine: Last RF: 07/21/20 #30  HCTZ: 10/15/19

## 2020-11-05 ENCOUNTER — Encounter: Payer: BC Managed Care – PPO | Admitting: Family Medicine

## 2020-12-16 ENCOUNTER — Other Ambulatory Visit: Payer: Self-pay

## 2020-12-16 ENCOUNTER — Ambulatory Visit (INDEPENDENT_AMBULATORY_CARE_PROVIDER_SITE_OTHER): Payer: BC Managed Care – PPO | Admitting: Family Medicine

## 2020-12-16 ENCOUNTER — Encounter: Payer: Self-pay | Admitting: Family Medicine

## 2020-12-16 VITALS — BP 158/103 | HR 100 | Temp 98.2°F | Resp 16 | Ht 74.0 in | Wt 307.8 lb

## 2020-12-16 DIAGNOSIS — E782 Mixed hyperlipidemia: Secondary | ICD-10-CM | POA: Diagnosis not present

## 2020-12-16 DIAGNOSIS — I10 Essential (primary) hypertension: Secondary | ICD-10-CM | POA: Diagnosis not present

## 2020-12-16 DIAGNOSIS — Z0000 Encounter for general adult medical examination without abnormal findings: Secondary | ICD-10-CM

## 2020-12-16 DIAGNOSIS — Z1159 Encounter for screening for other viral diseases: Secondary | ICD-10-CM

## 2020-12-16 DIAGNOSIS — Z6838 Body mass index (BMI) 38.0-38.9, adult: Secondary | ICD-10-CM

## 2020-12-16 NOTE — Progress Notes (Signed)
Complete physical exam   Patient: James Cooley   DOB: [birth year]   35 y.o. Male  MRN: [id number] Visit Date: 12/16/2020  Today's healthcare provider: Vernie Murders, PA-C   Chief Complaint  Patient presents with   Annual Exam   Subjective    James Cooley is a 35 y.o. male who presents today for a complete physical exam.  He reports consuming a general diet. The patient does not participate in regular exercise at present. He generally feels well. He reports sleeping well. He does not have additional problems to discuss today.   07/16/2019 CPE  Past Medical History:  Diagnosis Date   Asthma    Past Surgical History:  Procedure Laterality Date   NO PAST SURGERIES     Social History   Socioeconomic History   Marital status: Married    Spouse name: Not on file   Number of children: Not on file   Years of education: Not on file   Highest education level: Not on file  Occupational History   Not on file  Tobacco Use   Smoking status: Never Smoker   Smokeless tobacco: Never Used  Vaping Use   Vaping Use: Never used  Substance and Sexual Activity   Alcohol use: Yes    Comment: 1 per day   Drug use: No   Sexual activity: Yes  Other Topics Concern   Not on file  Social History Narrative   Not on file   Social Determinants of Health   Financial Resource Strain: Not on file  Food Insecurity: Not on file  Transportation Needs: Not on file  Physical Activity: Not on file  Stress: Not on file  Social Connections: Not on file  Intimate Partner Violence: Not on file   Family Status  Relation Name Status   Mother  Deceased at age 74       breast cancer, pneumonia   Father  Alive   MGM  Alive   PGM  (Not Specified)   Brother  (Not Specified)   MGF  Deceased   Family History  Problem Relation Age of Onset   Breast cancer Mother    Pneumonia Mother    Hypertension Father    Sleep apnea Father    Depression Paternal Grandmother    Mental illness  Paternal Grandmother    Sleep apnea Brother    Diabetes Maternal Grandfather    Lung cancer Maternal Grandfather    Allergies  Allergen Reactions   Amoxicillin Rash    Patient Care Team: Mar Daring, PA-C as PCP - General (Family Medicine)   Medications: Outpatient Medications Prior to Visit  Medication Sig   amLODipine (NORVASC) 10 MG tablet TAKE 1 TABLET DAILY   hydrochlorothiazide (HYDRODIURIL) 25 MG tablet TAKE 1 TABLET DAILY   omega-3 acid ethyl esters (LOVAZA) 1 g capsule TAKE 2 CAPSULES (2 GRAMS   TOTAL) TWO TIMES A DAY   pravastatin (PRAVACHOL) 40 MG tablet TAKE 1 TABLET DAILY   [DISCONTINUED] Glucosamine-Chondroitin (OSTEO BI-FLEX REGULAR STRENGTH PO) Take by mouth.   [DISCONTINUED] Multiple Vitamin (MULTI-VITAMINS) TABS Take by mouth.   [DISCONTINUED] albuterol (VENTOLIN HFA) 108 (90 Base) MCG/ACT inhaler TAKE 2 PUFFS BY MOUTH EVERY 6 HOURS AS NEEDED FOR WHEEZE OR SHORTNESS OF BREATH   [DISCONTINUED] benzonatate (TESSALON) 200 MG capsule Take 1 capsule (200 mg total) by mouth 3 (three) times daily as needed.   [DISCONTINUED] Omega-3 Fatty Acids (FISH OIL OMEGA-3 PO) Take by mouth.   No facility-administered  medications prior to visit.    Review of Systems  Constitutional: Negative.   HENT: Negative.   Eyes: Negative.   Respiratory: Negative.   Cardiovascular: Negative.   Gastrointestinal: Negative.   Endocrine: Negative.   Genitourinary: Negative.   Musculoskeletal: Positive for arthralgias and back pain.  Skin: Negative.   Allergic/Immunologic: Negative.   Neurological: Negative.   Hematological: Negative.   Psychiatric/Behavioral: Negative.     Last CBC Lab Results  Component Value Date   WBC 7.9 07/25/2019   HGB 15.2 07/25/2019   HCT 43.4 07/25/2019   MCV 89 07/25/2019   MCH 31.2 07/25/2019   RDW 12.1 07/25/2019   PLT 293 21/22/4825   Last metabolic panel Lab Results  Component Value Date   GLUCOSE 115 (H) 07/25/2019   NA 138  07/25/2019   K 3.7 07/25/2019   CL 97 07/25/2019   CO2 24 07/25/2019   BUN 17 07/25/2019   CREATININE 1.10 07/25/2019   GFRNONAA 88 07/25/2019   GFRAA 102 07/25/2019   CALCIUM 9.6 07/25/2019   PROT 7.1 07/25/2019   ALBUMIN 4.8 07/25/2019   LABGLOB 2.3 07/25/2019   AGRATIO 2.1 07/25/2019   BILITOT 0.7 07/25/2019   ALKPHOS 94 07/25/2019   AST 30 07/25/2019   ALT 55 (H) 07/25/2019   Last lipids Lab Results  Component Value Date   CHOL 197 07/25/2019   HDL 30 (L) 07/25/2019   LDLCALC 69 07/25/2019   TRIG 635 (HH) 07/25/2019   CHOLHDL 6.6 (H) 07/25/2019   Last hemoglobin A1c Lab Results  Component Value Date   HGBA1C 5.3 07/25/2019   Last thyroid functions Lab Results  Component Value Date   TSH 1.560 07/25/2019      Objective    BP (!) 158/103 (BP Location: Right Arm, Patient Position: Sitting, Cuff Size: Large)   Pulse 100   Temp 98.2 F (36.8 C) (Oral)   Resp 16   Ht 6' 2" (1.88 m)   Wt (!) 307 lb 12.8 oz (139.6 kg)   SpO2 100%   BMI 39.52 kg/m  BP Readings from Last 3 Encounters:  12/16/20 (!) 158/103  07/16/20 (!) 160/115  07/16/19 (!) 186/122   Wt Readings from Last 3 Encounters:  12/16/20 (!) 307 lb 12.8 oz (139.6 kg)  07/16/19 (!) 302 lb (137 kg)  02/06/18 282 lb 9.6 oz (128.2 kg)      Physical Exam Constitutional:      Appearance: He is well-developed.  HENT:     Head: Normocephalic and atraumatic.     Right Ear: External ear normal.     Left Ear: External ear normal.     Nose: Nose normal.  Eyes:     General:        Right eye: No discharge.     Conjunctiva/sclera: Conjunctivae normal.     Pupils: Pupils are equal, round, and reactive to light.  Neck:     Thyroid: No thyromegaly.     Trachea: No tracheal deviation.  Cardiovascular:     Rate and Rhythm: Normal rate and regular rhythm.     Heart sounds: Normal heart sounds. No murmur heard.   Pulmonary:     Effort: Pulmonary effort is normal. No respiratory distress.     Breath  sounds: Normal breath sounds. No wheezing or rales.  Chest:     Chest wall: No tenderness.  Abdominal:     General: There is no distension.     Palpations: Abdomen is soft. There is no mass.  Tenderness: There is no abdominal tenderness. There is no guarding or rebound.  Genitourinary:    Penis: Normal.      Testes: Normal.  Musculoskeletal:        General: No tenderness. Normal range of motion.     Cervical back: Normal range of motion and neck supple.  Lymphadenopathy:     Cervical: No cervical adenopathy.  Skin:    General: Skin is warm and dry.     Findings: No erythema or rash.  Neurological:     Mental Status: He is alert and oriented to person, place, and time.     Cranial Nerves: No cranial nerve deficit.     Motor: No abnormal muscle tone.     Coordination: Coordination normal.     Deep Tendon Reflexes: Reflexes are normal and symmetric. Reflexes normal.  Psychiatric:        Behavior: Behavior normal.        Thought Content: Thought content normal.        Judgment: Judgment normal.     Last depression screening scores PHQ 2/9 Scores 12/16/2020 07/16/2019 02/06/2018  PHQ - 2 Score 0 0 0  PHQ- 9 Score 0 0 -   Last fall risk screening Fall Risk  12/16/2020  Falls in the past year? 0  Comment -  Number falls in past yr: 0  Injury with Fall? 0  Risk for fall due to : No Fall Risks  Follow up Falls evaluation completed   Last Audit-C alcohol use screening Alcohol Use Disorder Test (AUDIT) 12/16/2020  1. How often do you have a drink containing alcohol? 4  2. How many drinks containing alcohol do you have on a typical day when you are drinking? 0  3. How often do you have six or more drinks on one occasion? 1  AUDIT-C Score 5  4. How often during the last year have you found that you were not able to stop drinking once you had started? 0  5. How often during the last year have you failed to do what was normally expected from you because of drinking? 0  6. How often  during the last year have you needed a first drink in the morning to get yourself going after a heavy drinking session? 0  7. How often during the last year have you had a feeling of guilt of remorse after drinking? 0  8. How often during the last year have you been unable to remember what happened the night before because you had been drinking? 0  9. Have you or someone else been injured as a result of your drinking? 0  10. Has a relative or friend or a doctor or another health worker been concerned about your drinking or suggested you cut down? 0  Alcohol Use Disorder Identification Test Final Score (AUDIT) 5  Alcohol Brief Interventions/Follow-up -   A score of 3 or more in women, and 4 or more in men indicates increased risk for alcohol abuse, EXCEPT if all of the points are from question 1   No results found for any visits on 12/16/20.  Assessment & Plan    Routine Health Maintenance and Physical Exam  Exercise Activities and Dietary recommendations Goals   Should get on weight reduction diet and exercise 30-40 minutes daily.     Immunization History  Administered Date(s) Administered   Hepatitis B 04/28/1998, 05/26/1998, 10/27/1998   Influenza,inj,Quad PF,6+ Mos 05/21/2015, 04/22/2018, 07/16/2019   MMR 11/27/2004   PFIZER(Purple  Top)SARS-COV-2 Vaccination 09/17/2019, 10/10/2019, 06/18/2020   Tdap 02/06/2018    Health Maintenance  Topic Date Due   Pneumococcal Vaccine 37-95 Years old (1 of 2 - PPSV23) Never done   Hepatitis C Screening  Never done   INFLUENZA VACCINE  02/09/2021   TETANUS/TDAP  02/07/2028   Zoster Vaccines- Shingrix (1 of 2) 10/29/2036   COVID-19 Vaccine  Completed   HIV Screening  Completed   HPV VACCINES  Aged Out    Discussed health benefits of physical activity, and encouraged him to engage in regular exercise appropriate for his age and condition.  1. Annual physical exam Other than obesity and elevated BP, patient's health is stable. Check  follow up labs and counseled regarding health maintenance. - Comprehensive metabolic panel - Lipid Panel With LDL/HDL Ratio - CBC with Differential/Platelet  2. Essential hypertension BP very high today. Has been on Amlodipine 10 mg qd and HCTZ 25 mg qd. Get back on regimen for BP control.  - Lipid Panel With LDL/HDL Ratio - Hemoglobin A1c - TSH - CBC with Differential/Platelet - Comprehensive metabolic panel  3. Hypercholesterolemia with hypertriglyceridemia Tolerating the Pravastatin 40 mg qd with Lovaza 2 gm BID and need follow up labs. Recheck pending reports. - Comprehensive metabolic panel - Lipid Panel With LDL/HDL Ratio - TSH - CBC with Differential/Platelet  4. Class 2 severe obesity due to excess calories with serious comorbidity and body mass index (BMI) of 38.0 to 38.9 in adult [facility]) Needs weight reduction to help with hypertension control and reduce risk of CVD and joint/back issues. Recheck labs. - Lipid Panel With LDL/HDL Ratio - Hemoglobin A1c - TSH - CBC with Differential/Platelet  5. Encounter for hepatitis C screening test for low risk patient - Hepatitis C antibody   Return in about 4 months (around 04/17/2021) for chronic disease f/u.     I, Dennis Chrismon, PA-C, have reviewed all documentation for this visit. The documentation on 04/05/21 for the exam, diagnosis, procedures, and orders are all accurate and complete.    Vernie Murders, PA-C  [employer] [phone] (phone) [phone] (fax)  [place]

## 2020-12-16 NOTE — Patient Instructions (Signed)
Preventive Care 21-34 Years Old, Male Preventive care refers to lifestyle choices and visits with your health care provider that can promote health and wellness. This includes:  A yearly physical exam. This is also called an annual wellness visit.  Regular dental and eye exams.  Immunizations.  Screening for certain conditions.  Healthy lifestyle choices, such as: ? Eating a healthy diet. ? Getting regular exercise. ? Not using drugs or products that contain nicotine and tobacco. ? Limiting alcohol use. What can I expect for my preventive care visit? Physical exam Your health care provider may check your:  Height and weight. These may be used to calculate your BMI (body mass index). BMI is a measurement that tells if you are at a healthy weight.  Heart rate and blood pressure.  Body temperature.  Skin for abnormal spots. Counseling Your health care provider may ask you questions about your:  Past medical problems.  Family's medical history.  Alcohol, tobacco, and drug use.  Emotional well-being.  Home life and relationship well-being.  Sexual activity.  Diet, exercise, and sleep habits.  Work and work environment.  Access to firearms. What immunizations do I need? Vaccines are usually given at various ages, according to a schedule. Your health care provider will recommend vaccines for you based on your age, medical history, and lifestyle or other factors, such as travel or where you work.   What tests do I need? Blood tests  Lipid and cholesterol levels. These may be checked every 5 years starting at age 20.  Hepatitis C test.  Hepatitis B test. Screening  Diabetes screening. This is done by checking your blood sugar (glucose) after you have not eaten for a while (fasting).  Genital exam to check for testicular cancer or hernias.  STD (sexually transmitted disease) testing, if you are at risk. Talk with your health care provider about your test results,  treatment options, and if necessary, the need for more tests.   Follow these instructions at home: Eating and drinking  Eat a healthy diet that includes fresh fruits and vegetables, whole grains, lean protein, and low-fat dairy products.  Drink enough fluid to keep your urine pale yellow.  Take vitamin and mineral supplements as recommended by your health care provider.  Do not drink alcohol if your health care provider tells you not to drink.  If you drink alcohol: ? Limit how much you have to 0-2 drinks a day. ? Be aware of how much alcohol is in your drink. In the [place] one drink equals one 12 oz bottle of beer (355 mL), one 5 oz glass of wine (148 mL), or one 1 oz glass of hard liquor (44 mL).   Lifestyle  Take daily care of your teeth and gums. Brush your teeth every morning and night with fluoride toothpaste. Floss one time each day.  Stay active. Exercise for at least 30 minutes 5 or more days each week.  Do not use any products that contain nicotine or tobacco, such as cigarettes, e-cigarettes, and chewing tobacco. If you need help quitting, ask your health care provider.  Do not use drugs.  If you are sexually active, practice safe sex. Use a condom or other form of protection to prevent STIs (sexually transmitted infections).  Find healthy ways to cope with stress, such as: ? Meditation, yoga, or listening to music. ? Journaling. ? Talking to a trusted person. ? Spending time with friends and family. Safety  Always wear your seat belt while driving   or riding in a vehicle.  Do not drive: ? If you have been drinking alcohol. Do not ride with someone who has been drinking. ? When you are tired or distracted. ? While texting.  Wear a helmet and other protective equipment during sports activities.  If you have firearms in your house, make sure you follow all gun safety procedures.  Seek help if you have been physically or sexually abused. What's next?  Go to your  health care provider once a year for an annual wellness visit.  Ask your health care provider how often you should have your eyes and teeth checked.  Stay up to date on all vaccines. This information is not intended to replace advice given to you by your health care provider. Make sure you discuss any questions you have with your health care provider. Document Revised: 03/14/2019 Document Reviewed: 06/22/2018 Elsevier Patient Education  2021 Elsevier Inc.  

## 2021-04-02 ENCOUNTER — Telehealth: Payer: Self-pay

## 2021-04-02 DIAGNOSIS — I10 Essential (primary) hypertension: Secondary | ICD-10-CM

## 2021-04-02 MED ORDER — HYDROCHLOROTHIAZIDE 25 MG PO TABS: 25.0000 mg | ORAL_TABLET | Freq: Every day | ORAL | 0 refills | Status: DC

## 2021-04-02 MED ORDER — AMLODIPINE BESYLATE 10 MG PO TABS: 10.0000 mg | ORAL_TABLET | Freq: Every day | ORAL | 0 refills | Status: DC

## 2021-04-02 NOTE — Telephone Encounter (Signed)
[facility] faxed refill request for the following medications:  amLODipine (NORVASC) 10 MG tablet  hydrochlorothiazide (HYDRODIURIL) 25 MG tablet  Please advise.

## 2021-04-03 ENCOUNTER — Telehealth: Payer: Self-pay

## 2021-04-03 DIAGNOSIS — E781 Pure hyperglyceridemia: Secondary | ICD-10-CM

## 2021-04-03 DIAGNOSIS — E7800 Pure hypercholesterolemia, unspecified: Secondary | ICD-10-CM

## 2021-04-03 MED ORDER — PRAVASTATIN SODIUM 40 MG PO TABS: 40.0000 mg | ORAL_TABLET | Freq: Every day | ORAL | 1 refills | Status: DC

## 2021-04-03 NOTE — Telephone Encounter (Signed)
[facility] faxed refill request for the following medications:  pravastatin (PRAVACHOL) 40 MG tablet   Please advise.

## 2021-04-05 ENCOUNTER — Encounter: Payer: Self-pay | Admitting: Family Medicine

## 2021-04-05 DIAGNOSIS — I10 Essential (primary) hypertension: Secondary | ICD-10-CM

## 2021-04-06 MED ORDER — HYDROCHLOROTHIAZIDE 25 MG PO TABS: 25.0000 mg | ORAL_TABLET | Freq: Every day | ORAL | 1 refills | Status: DC

## 2021-04-06 MED ORDER — AMLODIPINE BESYLATE 10 MG PO TABS: 10.0000 mg | ORAL_TABLET | Freq: Every day | ORAL | 1 refills | Status: DC

## 2021-04-20 ENCOUNTER — Telehealth: Payer: Self-pay

## 2021-10-29 ENCOUNTER — Other Ambulatory Visit: Payer: Self-pay | Admitting: Family Medicine

## 2021-10-29 DIAGNOSIS — E7800 Pure hypercholesterolemia, unspecified: Secondary | ICD-10-CM

## 2021-10-29 DIAGNOSIS — I10 Essential (primary) hypertension: Secondary | ICD-10-CM

## 2021-10-29 DIAGNOSIS — E781 Pure hyperglyceridemia: Secondary | ICD-10-CM

## 2021-10-29 NOTE — Telephone Encounter (Signed)
Requested medication (s) are due for refill today: yes ? ?Requested medication (s) are on the active medication list: yes ? ?Last refill:  04/03/21 ? ?Future visit scheduled: no ? ?Notes to clinic: Unable to refill per protocol, appointment needed. ? ? ? ? ?  ?Requested Prescriptions  ?Pending Prescriptions Disp Refills  ? pravastatin (PRAVACHOL) 40 MG tablet [Pharmacy Med Name: PRAVASTATIN TABS 40MG ] 90 tablet 3  ?  Sig: TAKE 1 TABLET DAILY  ?  ? Cardiovascular:  Antilipid - Statins Failed - 10/29/2021  1:26 AM  ?  ?  Failed - Lipid Panel in normal range within the last 12 months  ?  Cholesterol, Total  ?Date Value Ref Range Status  ?07/25/2019 197 100 - 199 mg/dL Final  ? ?LDL Chol Calc (NIH)  ?Date Value Ref Range Status  ?07/25/2019 69 0 - 99 mg/dL Final  ? ?HDL  ?Date Value Ref Range Status  ?07/25/2019 30 (L) >39 mg/dL Final  ? ?Triglycerides  ?Date Value Ref Range Status  ?07/25/2019 635 (HH) 0 - 149 mg/dL Final  ? ?  ?  ?  Passed - Patient is not pregnant  ?  ?  Passed - Valid encounter within last 12 months  ?  Recent Outpatient Visits   ? ?      ? 10 months ago Annual physical exam  ? [facility] [place], [facility], PA-C  ? 1 year ago Atypical pneumonia  ? [facility] [place], [place], [place]  ? 1 year ago Fever, unspecified fever cause  ? [facility] [place], [place], [place]andra Bevels  ? 2 years ago Annual physical exam  ? [facility] [place], [place], [place]andra Bevel[place]  ? 3 years ago Annual physical exam  ? [facility] [place], [place], [place] Bevels  ? ?  ?  ? ? ?  ?  ?  ? ? ?

## 2021-10-29 NOTE — Telephone Encounter (Signed)
Requested medication (s) are due for refill today: yes ? ?Requested medication (s) are on the active medication list: yes ? ?Last refill:  04/05/22 ? ?Future visit scheduled: no ? ?Notes to clinic:  Unable to refill per protocol, appointment needed.  Last OV 12/16/20. ? ? ?  ?Requested Prescriptions  ?Pending Prescriptions Disp Refills  ? amLODipine (NORVASC) 10 MG tablet [Pharmacy Med Name: AMLODIPINE BESYLATE TABS 10MG ] 90 tablet 3  ?  Sig: TAKE 1 TABLET DAILY  ?  ? Cardiovascular: Calcium Channel Blockers 2 Failed - 10/29/2021  1:26 AM  ?  ?  Failed - Last BP in normal range  ?  BP Readings from Last 1 Encounters:  ?12/16/20 (!) 158/103  ?  ?  ?  ?  Failed - Valid encounter within last 6 months  ?  Recent Outpatient Visits   ? ?      ? 10 months ago Annual physical exam  ? [place], PA-C  ? 1 year ago Atypical pneumonia  ? [place], [place]  ? 1 year ago Fever, unspecified fever cause  ? [facility] [place], Cle[place] Sorrel, [place]  ? 2 years ago Annual physical exam  ? [facility] [place], [place], [place]  ? 3 years ago Annual physical exam  ? [place], Anderson Malta M, [place]  ? ?  ?  ? ? ?  ?  ?  Passed - Last Heart Rate in normal range  ?  Pulse Readings from Last 1 Encounters:  ?12/16/20 100  ?  ?  ?  ?  ? hydrochlorothiazide (HYDRODIURIL) 25 MG tablet [Pharmacy Med Name: HYDROCHLOROTHIAZIDE TABS 25MG ] 90 tablet 3  ?  Sig: TAKE 1 TABLET DAILY  ?  ? Cardiovascular: Diuretics - Thiazide Failed - 10/29/2021  1:26 AM  ?  ?  Failed - Cr in normal range and within 180 days  ?  Creatinine, Ser  ?Date Value Ref Range Status  ?07/25/2019 1.10 0.76 - 1.27 mg/dL Final  ?  ?  ?  ?  Failed - K in normal range and within 180 days  ?  Potassium  ?Date Value Ref Range Status  ?07/25/2019 3.7 3.5 - 5.2 mmol/L Final  ?  ?  ?  ?  Failed - Na in normal range and within 180 days  ?  Sodium  ?Date Value Ref Range  Status  ?07/25/2019 138 134 - 144 mmol/L Final  ?  ?  ?  ?  Failed - Last BP in normal range  ?  BP Readings from Last 1 Encounters:  ?12/16/20 (!) 158/103  ?  ?  ?  ?  Failed - Valid encounter within last 6 months  ?  Recent Outpatient Visits   ? ?      ? 10 months ago Annual physical exam  ? [place], PA-C  ? 1 year ago Atypical pneumonia  ? [place], [place]  ? 1 year ago Fever, unspecified fever cause  ? [facility] [place], Clearnce Sorrel, [place]  ? 2 years ago Annual physical exam  ? [facility] [place], Clearnce Sorrel, [place]  ? 3 years ago Annual physical exam  ? [facility] [place], Clearnce Sorrel, [place]  ? ?  ?  ? ? ?  ?  ?  ? ? ?

## 2021-12-10 ENCOUNTER — Other Ambulatory Visit: Payer: Self-pay

## 2021-12-10 ENCOUNTER — Ambulatory Visit (INDEPENDENT_AMBULATORY_CARE_PROVIDER_SITE_OTHER): Payer: BC Managed Care – PPO

## 2021-12-10 ENCOUNTER — Ambulatory Visit
Admission: RE | Admit: 2021-12-10 | Discharge: 2021-12-10 | Disposition: A | Discharge status: Home / Self Care | Payer: BC Managed Care – PPO | Source: Ambulatory Visit | Attending: Family Medicine | Admitting: Family Medicine

## 2021-12-10 VITALS — BP 176/88 | HR 125 | Temp 99.5°F | Resp 18

## 2021-12-10 DIAGNOSIS — R509 Fever, unspecified: Secondary | ICD-10-CM

## 2021-12-10 DIAGNOSIS — J189 Pneumonia, unspecified organism: Secondary | ICD-10-CM | POA: Diagnosis not present

## 2021-12-10 DIAGNOSIS — R000 Tachycardia, unspecified: Secondary | ICD-10-CM | POA: Diagnosis not present

## 2021-12-10 DIAGNOSIS — J989 Respiratory disorder, unspecified: Secondary | ICD-10-CM | POA: Diagnosis not present

## 2021-12-10 LAB — POCT RAPID STREP A (OFFICE): Rapid Strep A Screen: NEGATIVE

## 2021-12-10 MED ORDER — PREDNISONE 20 MG PO TABS: 40.0000 mg | ORAL_TABLET | Freq: Every day | ORAL | 0 refills | Status: AC

## 2021-12-10 MED ORDER — ALBUTEROL SULFATE HFA 108 (90 BASE) MCG/ACT IN AERS
2.0000 | INHALATION_SPRAY | Freq: Once | RESPIRATORY_TRACT | Status: AC
  Administered 2021-12-10: 2 via RESPIRATORY_TRACT

## 2021-12-10 MED ORDER — PROMETHAZINE-DM 6.25-15 MG/5ML PO SYRP: 5.0000 mL | ORAL_SOLUTION | Freq: Three times a day (TID) | ORAL | 0 refills | Status: DC | PRN

## 2021-12-10 MED ORDER — ALBUTEROL SULFATE HFA 108 (90 BASE) MCG/ACT IN AERS: 2.0000 | INHALATION_SPRAY | Freq: Four times a day (QID) | RESPIRATORY_TRACT | 0 refills | Status: DC | PRN

## 2021-12-10 MED ORDER — DOXYCYCLINE HYCLATE 100 MG PO CAPS
100.0000 mg | ORAL_CAPSULE | Freq: Two times a day (BID) | ORAL | 0 refills | Status: DC
Start: 2021-12-10 — End: 2022-01-06

## 2021-12-10 MED ORDER — ACETAMINOPHEN 325 MG PO TABS
650.0000 mg | ORAL_TABLET | Freq: Once | ORAL | Status: AC
  Administered 2021-12-10: 650 mg via ORAL

## 2021-12-10 NOTE — ED Triage Notes (Signed)
Pt presents with fever, cough, ST, bodyaches x 4 days. At home covid test negative yesterday.

## 2021-12-10 NOTE — ED Provider Notes (Signed)
[facility]    CSN: [id number] Arrival date & time: 12/10/21  1356      History   Chief Complaint Chief Complaint  Patient presents with   Fever    I have had a fever of 100-101 during the day since Tuesday and spiking at night to 102 with Tylenol and ibuprofen and a phlemy cough. No other symptoms. - Entered by patient   Cough   Sore Throat    HPI James Cooley is a 36 y.o. male.   HPI Patient with a history of asthma, presents today with 3-4 day history of fever-TMAX 102 last night, cough, sore throat. He endorses last night his fever was at the highest and he abruptly developed SOB during the night. He suffers from hypertension and BP is elevated at present. Both he and his wife are currently sick. Home COVID test yesterday for negative. Last took tylenol prior to arrival, however remains febrile. He endorses poor fluid and food intake since onset of symptoms. Past Medical History:  Diagnosis Date   Asthma     Patient Active Problem List   Diagnosis Date Noted   Hypercholesterolemia with hypertriglyceridemia 02/11/2016   Hypertension 05/21/2015   Kidney stones 05/21/2015    Past Surgical History:  Procedure Laterality Date   NO PAST SURGERIES         Home Medications    Prior to Admission medications   Medication Sig Start Date End Date Taking? Authorizing Provider  albuterol (VENTOLIN HFA) 108 (90 Base) MCG/ACT inhaler Inhale 2 puffs into the lungs every 6 (six) hours as needed for wheezing or shortness of breath. 12/10/21  Yes Bing NeighborsHarris, Rhen Dossantos S, FNP  doxycycline (VIBRAMYCIN) 100 MG capsule Take 1 capsule (100 mg total) by mouth 2 (two) times daily. 12/10/21  Yes Bing NeighborsHarris, Jilda Kress S, FNP  predniSONE (DELTASONE) 20 MG tablet Take 2 tablets (40 mg total) by mouth daily with breakfast for 5 days. 12/11/21 12/16/21 Yes Bing NeighborsHarris, Mitchell Epling S, FNP  promethazine-dextromethorphan (PROMETHAZINE-DM) 6.25-15 MG/5ML syrup Take 5 mLs by mouth 3 (three) times daily as  needed for cough. 12/10/21  Yes Bing NeighborsHarris, Kenyatta Gloeckner S, FNP  amLODipine (NORVASC) 10 MG tablet TAKE 1 TABLET DAILY 10/29/21   Erasmo DownerBacigalupo, Angela M, MD  hydrochlorothiazide (HYDRODIURIL) 25 MG tablet TAKE 1 TABLET DAILY 10/29/21   Erasmo DownerBacigalupo, Angela M, MD  omega-3 acid ethyl esters (LOVAZA) 1 g capsule TAKE 2 CAPSULES (2 GRAMS   TOTAL) TWO TIMES A DAY 04/09/20   Margaretann LovelessBurnette, Jennifer M, PA-C  pravastatin (PRAVACHOL) 40 MG tablet TAKE 1 TABLET DAILY 10/29/21   Bacigalupo, Marzella SchleinAngela M, MD    Family History Family History  Problem Relation Age of Onset   Breast cancer Mother    Pneumonia Mother    Hypertension Father    Sleep apnea Father    Depression Paternal Grandmother    Mental illness Paternal Grandmother    Sleep apnea Brother    Diabetes Maternal Grandfather    Lung cancer Maternal Grandfather     Social History Social History   Tobacco Use   Smoking status: Never   Smokeless tobacco: Never  Vaping Use   Vaping Use: Never used  Substance Use Topics   Alcohol use: Yes    Comment: 1 per day   Drug use: No     Allergies   Amoxicillin   Review of Systems Review of Systems Pertinent negatives listed in HPI   Physical Exam Triage Vital Signs ED Triage Vitals  Enc Vitals Group  BP 12/10/21 1415 (!) 180/80     Pulse Rate 12/10/21 1415 (!) 124     Resp 12/10/21 1415 18     Temp 12/10/21 1415 100.1 F (37.8 C)     Temp Source 12/10/21 1415 Oral     SpO2 12/10/21 1415 96 %     Weight --      Height --      Head Circumference --      Peak Flow --      Pain Score 12/10/21 1416 8     Pain Loc --      Pain Edu? --      Excl. in [place]? --    No data found.  Updated Vital Signs BP (!) 176/88 (BP Location: Left Arm)   Pulse (!) 125   Temp 99.5 F (37.5 C)   Resp 18   SpO2 95%   Visual Acuity Right Eye Distance:   Left Eye Distance:   Bilateral Distance:    Right Eye Near:   Left Eye Near:    Bilateral Near:     Physical Exam Constitutional:      General: He is  not in acute distress.    Appearance: He is obese. He is ill-appearing. He is not toxic-appearing.  HENT:     Head: Normocephalic and atraumatic.     Nose: Congestion and rhinorrhea present.     Mouth/Throat:     Pharynx: Pharyngeal swelling and posterior oropharyngeal erythema present.  Eyes:     Conjunctiva/sclera: Conjunctivae normal.     Pupils: Pupils are equal, round, and reactive to light.  Cardiovascular:     Rate and Rhythm: Regular rhythm. Tachycardia present.  Pulmonary:     Breath sounds: Wheezing and rhonchi present.  Lymphadenopathy:     Cervical: Cervical adenopathy present.  Skin:    General: Skin is warm and dry.     Capillary Refill: Capillary refill takes less than 2 seconds.  Neurological:     General: No focal deficit present.     Mental Status: He is alert and oriented to person, place, and time.  Psychiatric:        Mood and Affect: Mood normal.        Behavior: Behavior normal.     UC Treatments / Results  Labs (all labs ordered are listed, but only abnormal results are displayed) Labs Reviewed  POCT RAPID STREP A (OFFICE)    EKG   Radiology DG Chest 2 View  Result Date: 12/10/2021 CLINICAL DATA:  Cough, shortness of breath EXAM: CHEST - 2 VIEW COMPARISON:  07/16/2020 FINDINGS: The heart size and mediastinal contours are within normal limits. New left lower lobe airspace opacity. Right lung is clear. No pleural effusion or pneumothorax. The visualized skeletal structures are unremarkable. IMPRESSION: Left lower lobe pneumonia. Electronically Signed   By: Davina Poke D.O.   On: 12/10/2021 15:04     Procedures Procedures (including critical care time)  Medications Ordered in UC Medications  acetaminophen (TYLENOL) tablet 650 mg (650 mg Oral Given 12/10/21 1443)  albuterol (VENTOLIN HFA) 108 (90 Base) MCG/ACT inhaler 2 puff (2 puffs Inhalation Given 12/10/21 1530)    Initial Impression / Assessment and Plan / UC Course  I have reviewed the  triage vital signs and the nursing notes.  Pertinent labs & imaging results that were available during my care of the patient were reviewed by me and considered in my medical decision making (see chart for details).  Left lower lobe  pneumonia Start doxycyline, get two doses in today Hydrate well with fluids. Aggressively manage fever with around the clock tylenol and ibuprofen.  Once fever controlled, start prednisone tomorrow Albuterol inhaler 2 puffs every 4-6 hours as needed for shortness of breath and or wheezing. Strict ER precautions give if symptoms worsen despite medication treatment. Patient verbalize understanding and agreement with plan.  Final Clinical Impressions(s) / UC Diagnoses   Final diagnoses:  Febrile respiratory illness  Tachycardia  Pneumonia of left lower lobe due to infectious organism     Discharge Instructions      Start steroids tomorrow.  Start doxycycline today.  I would like for you to drink continuous fluids throughout the rest the day to rehydrate and increase Tylenol 750 mg (1 and 1/2 tablet), every 4 hours and ibuprofen every 6-8 hours while febrile.  Continue to monitor your temperature.  Make sure that you have taking either Tylenol or ibuprofen prior to bedtime to reduce the risk of fever in the middle of the night.  If at any point your symptoms worsen or your fever persists over the next 48 hours I would like for you to return for evaluation or go to the nearest emergency department.     ED Prescriptions     Medication Sig Dispense Auth. Provider   doxycycline (VIBRAMYCIN) 100 MG capsule Take 1 capsule (100 mg total) by mouth 2 (two) times daily. 20 capsule Scot Jun, FNP   promethazine-dextromethorphan (PROMETHAZINE-DM) 6.25-15 MG/5ML syrup Take 5 mLs by mouth 3 (three) times daily as needed for cough. 180 mL Scot Jun, FNP   predniSONE (DELTASONE) 20 MG tablet Take 2 tablets (40 mg total) by mouth daily with breakfast for 5  days. 10 tablet Scot Jun, FNP   albuterol (VENTOLIN HFA) 108 (90 Base) MCG/ACT inhaler Inhale 2 puffs into the lungs every 6 (six) hours as needed for wheezing or shortness of breath. 1 each Scot Jun, FNP      PDMP not reviewed this encounter.   Scot Jun, FNP 12/14/21 1610

## 2021-12-10 NOTE — Discharge Instructions (Addendum)
Start steroids tomorrow.  Start doxycycline today.  I would like for you to drink continuous fluids throughout the rest the day to rehydrate and increase Tylenol 750 mg (1 and 1/2 tablet), every 4 hours and ibuprofen every 6-8 hours while febrile.  Continue to monitor your temperature.  Make sure that you have taking either Tylenol or ibuprofen prior to bedtime to reduce the risk of fever in the middle of the night.  If at any point your symptoms worsen or your fever persists over the next 48 hours I would like for you to return for evaluation or go to the nearest emergency department.

## 2022-01-05 NOTE — Progress Notes (Signed)
Complete physical exam   Patient: James Cooley   DOB: 03/07/1986   36 y.o. Male  MRN: [id number] Visit Date: 01/06/2022  Today's healthcare provider: Gwyneth Sprout, FNP  Patient presents for new patient visit to establish care.  Introduced to Designer, jewellery role and practice setting.  All questions answered.  Discussed provider/patient relationship and expectations.   I,Tiffany J Bragg,acting as a scribe for James Sprout, FNP.,have documented all relevant documentation on the behalf of James Sprout, FNP,as directed by  James Sprout, FNP while in the presence of James Sprout, FNP.   Chief Complaint  Patient presents with   Annual Exam   Hyperlipidemia   Hypertension   Subjective    James Cooley is a 36 y.o. male who presents today for a complete physical exam.  He reports consuming a general diet.  Does not exercise.  He generally feels well. He reports sleeping fairly well. He does not have additional problems to discuss today.  HPI  Hypertension, follow-up  BP Readings from Last 3 Encounters:  01/06/22 (!) 148/102  12/10/21 (!) 176/88  12/16/20 (!) 158/103   Wt Readings from Last 3 Encounters:  01/06/22 (!) 301 lb (136.5 kg)  12/16/20 (!) 307 lb 12.8 oz (139.6 kg)  07/16/19 (!) 302 lb (137 kg)     He was last seen for hypertension 1 years ago.  BP at that visit was 158/103. Management since that visit includes continue amlodipine 62m and HCTZ 238m  He reports excellent compliance with treatment. He is not having side effects.  He is following a Regular diet. He is not exercising. He does not smoke.  Use of agents associated with hypertension: NSAIDS.   Outside blood pressures are around 130/90 Symptoms: No chest pain No chest pressure  No palpitations No syncope  No dyspnea No orthopnea  No paroxysmal nocturnal dyspnea No lower extremity edema   Pertinent labs Lab Results  Component Value Date   CHOL 197 07/25/2019   HDL 30 (L)  07/25/2019   LDLCALC 69 07/25/2019   TRIG 635 (HH) 07/25/2019   CHOLHDL 6.6 (H) 07/25/2019   Lab Results  Component Value Date   NA 138 07/25/2019   K 3.7 07/25/2019   CREATININE 1.10 07/25/2019   GFRNONAA 88 07/25/2019   GLUCOSE 115 (H) 07/25/2019   TSH 1.560 07/25/2019     The ASCVD Risk score (Arnett DK, et al., 2019) failed to calculate for the following reasons:   The 2019 ASCVD risk score is only valid for ages 4069o 7990---------------------------------------------------------------------------------------------------  Lipid/Cholesterol, Follow-up  Last lipid panel Other pertinent labs  Lab Results  Component Value Date   CHOL 197 07/25/2019   HDL 30 (L) 07/25/2019   LDLCALC 69 07/25/2019   TRIG 635 (HH) 07/25/2019   CHOLHDL 6.6 (H) 07/25/2019   Lab Results  Component Value Date   ALT 55 (H) 07/25/2019   AST 30 07/25/2019   PLT 293 07/25/2019   TSH 1.560 07/25/2019     He was last seen for this 1 years ago.  Management since that visit includes continue pravastatin 4059mnd lovaza 2gm.  He reports excellent compliance with treatment. He is not having side effects.   Symptoms: No chest pain No chest pressure/discomfort  No dyspnea No lower extremity edema  No numbness or tingling of extremity No orthopnea  No palpitations No paroxysmal nocturnal dyspnea  No speech difficulty No syncope   Current diet: well balanced  Current exercise: none  The ASCVD Risk score (Arnett DK, et al., 2019) failed to calculate for the following reasons:   The 2019 ASCVD risk score is only valid for ages 74 to 32  ---------------------------------------------------------------------------------------------------   Past Medical History:  Diagnosis Date   Asthma    Past Surgical History:  Procedure Laterality Date   NO PAST SURGERIES     Social History   Socioeconomic History   Marital status: Married    Spouse name: Not on file   Number of children: Not on file    Years of education: Not on file   Highest education level: Not on file  Occupational History   Not on file  Tobacco Use   Smoking status: Never   Smokeless tobacco: Never  Vaping Use   Vaping Use: Never used  Substance and Sexual Activity   Alcohol use: Yes    Comment: 1 per day   Drug use: No   Sexual activity: Yes  Other Topics Concern   Not on file  Social History Narrative   Not on file   Social Determinants of Health   Financial Resource Strain: Not on file  Food Insecurity: Not on file  Transportation Needs: Not on file  Physical Activity: Not on file  Stress: Not on file  Social Connections: Not on file  Intimate Partner Violence: Not on file   Family Status  Relation Name Status   Mother  Deceased at age 5       breast cancer, pneumonia   Father  Alive   MGM  Alive   PGM  (Not Specified)   Brother  (Not Specified)   MGF  Deceased   Family History  Problem Relation Age of Onset   Breast cancer Mother    Pneumonia Mother    Hypertension Father    Sleep apnea Father    Depression Paternal Grandmother    Mental illness Paternal Grandmother    Sleep apnea Brother    Diabetes Maternal Grandfather    Lung cancer Maternal Grandfather    Allergies  Allergen Reactions   Amoxicillin Rash    Patient Care Team: James Sprout, FNP as PCP - General (Family Medicine)   Medications: Outpatient Medications Prior to Visit  Medication Sig   amLODipine (NORVASC) 10 MG tablet TAKE 1 TABLET DAILY   pravastatin (PRAVACHOL) 40 MG tablet TAKE 1 TABLET DAILY   [DISCONTINUED] hydrochlorothiazide (HYDRODIURIL) 25 MG tablet TAKE 1 TABLET DAILY   omega-3 acid ethyl esters (LOVAZA) 1 g capsule TAKE 2 CAPSULES (2 GRAMS   TOTAL) TWO TIMES A DAY (Patient not taking: Reported on 01/06/2022)   [DISCONTINUED] albuterol (VENTOLIN HFA) 108 (90 Base) MCG/ACT inhaler Inhale 2 puffs into the lungs every 6 (six) hours as needed for wheezing or shortness of breath.   [DISCONTINUED]  doxycycline (VIBRAMYCIN) 100 MG capsule Take 1 capsule (100 mg total) by mouth 2 (two) times daily.   [DISCONTINUED] promethazine-dextromethorphan (PROMETHAZINE-DM) 6.25-15 MG/5ML syrup Take 5 mLs by mouth 3 (three) times daily as needed for cough.   No facility-administered medications prior to visit.    Review of Systems  Musculoskeletal:  Positive for arthralgias, back pain and neck stiffness.      Objective     BP (!) 148/102 (BP Location: Right Arm, Patient Position: Sitting, Cuff Size: Large)   Pulse 76   Temp 98.4 F (36.9 C) (Oral)   Resp 16   Ht _0  (1.854 m)   Wt (!) 301 lb (136.5  kg)   SpO2 98%   BMI 39.71 kg/m     Physical Exam Vitals and nursing note reviewed.  Constitutional:      General: He is awake. He is not in acute distress.    Appearance: Normal appearance. He is well-developed and well-groomed. He is obese. He is not ill-appearing, toxic-appearing or diaphoretic.  HENT:     Head: Normocephalic and atraumatic.     Jaw: There is normal jaw occlusion. No trismus, tenderness, swelling or pain on movement.     Salivary Glands: Right salivary gland is not diffusely enlarged or tender. Left salivary gland is not diffusely enlarged or tender.     Right Ear: Hearing, tympanic membrane, ear canal and external ear normal. There is no impacted cerumen.     Left Ear: Hearing, tympanic membrane, ear canal and external ear normal. There is no impacted cerumen.     Nose: Nose normal. No congestion or rhinorrhea.     Right Turbinates: Not enlarged, swollen or pale.     Left Turbinates: Not enlarged, swollen or pale.     Right Sinus: No maxillary sinus tenderness or frontal sinus tenderness.     Left Sinus: No maxillary sinus tenderness or frontal sinus tenderness.     Mouth/Throat:     Lips: Pink.     Mouth: Mucous membranes are moist. No injury, lacerations, oral lesions or angioedema.     Pharynx: Oropharynx is clear. Uvula midline. No pharyngeal swelling,  oropharyngeal exudate or posterior oropharyngeal erythema.     Tonsils: No tonsillar exudate or tonsillar abscesses.  Eyes:     General: Lids are normal. Vision grossly intact. Gaze aligned appropriately.        Right eye: No discharge.        Left eye: No discharge.     Extraocular Movements: Extraocular movements intact.     Conjunctiva/sclera: Conjunctivae normal.     Pupils: Pupils are equal, round, and reactive to light.  Neck:     Thyroid: No thyroid mass, thyromegaly or thyroid tenderness.     Vascular: No carotid bruit.     Trachea: Trachea normal. No tracheal tenderness.  Cardiovascular:     Rate and Rhythm: Normal rate and regular rhythm.     Pulses: Normal pulses.          Carotid pulses are 2+ on the right side and 2+ on the left side.      Radial pulses are 2+ on the right side and 2+ on the left side.       Femoral pulses are 2+ on the right side and 2+ on the left side.      Popliteal pulses are 2+ on the right side and 2+ on the left side.       Dorsalis pedis pulses are 2+ on the right side and 2+ on the left side.       Posterior tibial pulses are 2+ on the right side and 2+ on the left side.     Heart sounds: Normal heart sounds, S1 normal and S2 normal. No murmur heard.    No friction rub. No gallop.  Pulmonary:     Effort: Pulmonary effort is normal. No respiratory distress.     Breath sounds: Normal breath sounds and air entry. No stridor. No wheezing, rhonchi or rales.  Chest:     Chest wall: No tenderness.  Abdominal:     General: Abdomen is flat. Bowel sounds are normal. There is no distension.  Palpations: Abdomen is soft. There is no mass.     Tenderness: There is no abdominal tenderness. There is no guarding or rebound.     Hernia: No hernia is present.  Genitourinary:    Comments: Exam deferred; denies complaints Musculoskeletal:        General: No swelling, tenderness, deformity or signs of injury. Normal range of motion.     Cervical back:  Normal range of motion and neck supple. No rigidity or tenderness.     Right lower leg: No edema.     Left lower leg: No edema.  Lymphadenopathy:     Cervical: No cervical adenopathy.     Right cervical: No superficial, deep or posterior cervical adenopathy.    Left cervical: No superficial, deep or posterior cervical adenopathy.  Skin:    General: Skin is warm and dry.     Capillary Refill: Capillary refill takes less than 2 seconds.     Coloration: Skin is not jaundiced or pale.     Findings: No bruising, erythema, lesion or rash.  Neurological:     General: No focal deficit present.     Mental Status: He is alert and oriented to person, place, and time. Mental status is at baseline.     GCS: GCS eye subscore is 4. GCS verbal subscore is 5. GCS motor subscore is 6.     Sensory: Sensation is intact. No sensory deficit.     Motor: Motor function is intact. No weakness.     Coordination: Coordination is intact.     Gait: Gait is intact.  Psychiatric:        Attention and Perception: Attention and perception normal.        Mood and Affect: Mood and affect normal.        Speech: Speech normal.        Behavior: Behavior normal. Behavior is cooperative.        Thought Content: Thought content normal.        Cognition and Memory: Cognition normal.        Judgment: Judgment normal.      Last depression screening scores    01/06/2022    9:51 AM 12/16/2020    3:13 PM 07/16/2019    4:01 PM  PHQ 2/9 Scores  PHQ - 2 Score 0 0 0  PHQ- 9 Score 2 0 0   Last fall risk screening    01/06/2022    9:50 AM  [place] in the past year? 0  Number falls in past yr: 0  Injury with Fall? 0   Last Audit-C alcohol use screening    01/06/2022    9:53 AM  Alcohol Use Disorder Test (AUDIT)  1. How often do you have a drink containing alcohol? 4  2. How many drinks containing alcohol do you have on a typical day when you are drinking? 1  3. How often do you have six or more drinks on one  occasion? 1  AUDIT-C Score 6   A score of 3 or more in women, and 4 or more in men indicates increased risk for alcohol abuse, EXCEPT if all of the points are from question 1   No results found for any visits on 01/06/22.  Assessment & Plan    Routine Health Maintenance and Physical Exam  Exercise Activities and Dietary recommendations  Goals   None     Immunization History  Administered Date(s) Administered   Hepatitis B 04/28/1998, 05/26/1998,  10/27/1998   Influenza,inj,Quad PF,6+ Mos 05/21/2015, 04/22/2018, 07/16/2019   MMR 11/27/2004   PFIZER(Purple Top)SARS-COV-2 Vaccination 09/17/2019, 10/10/2019, 06/18/2020   Tdap 02/06/2018    Health Maintenance  Topic Date Due   Hepatitis C Screening  Never done   COVID-19 Vaccine (4 - Pfizer series) 08/13/2020   INFLUENZA VACCINE  02/09/2022   TETANUS/TDAP  02/07/2028   HIV Screening  Completed   HPV VACCINES  Aged Out    Discussed health benefits of physical activity, and encouraged him to engage in regular exercise appropriate for his age and condition.  Problem List Items Addressed This Visit       Cardiovascular and Mediastinum   Essential hypertension    Chronic, elevated; recommend titration to 100 mg of losartan with plan to stop Norvasc at 10 mg to assist , in addition to HCTZ 25 mg Denies CP Denies SOB/ DOE Denies low blood pressure/hypotension Denies vision changes No LE Edema noted on exam; however, reports edema on BLE with travel and with recent beach trip Seek emergent care if you develop chest pain or chest pressure       Relevant Medications   losartan-hydrochlorothiazide (HYZAAR) 100-25 MG tablet     Other   Annual physical exam - Primary    UTD on dental and vision Has 77 year old son, 41 year old daughter Reports stressful job Married Things to do to keep yourself healthy  - Exercise at least 30-45 minutes a day, 3-4 days a week.  - Eat a low-fat diet with lots of fruits and vegetables, up to  7-9 servings per day.  - Seatbelts can save your life. Wear them always.  - Smoke detectors on every level of your home, check batteries every year.  - Eye Doctor - have an eye exam every 1-2 years  - Safe sex - if you may be exposed to STDs, use a condom.  - Alcohol -  If you drink, do it moderately, less than 2 drinks per day.  - [place]. Choose someone to speak for you if you are not able.  - Depression is common in our stressful world.If you're feeling down or losing interest in things you normally enjoy, please come in for a visit.  - Violence - If anyone is threatening or hurting you, please call immediately.        Relevant Orders   Comprehensive Metabolic Panel (CMET)   CBC with Differential/Platelet   Lipid panel   TSH + free T4   Encounter for hepatitis C screening test for low risk patient    Low risk screen Treatable, and curable. If left untreated Hep C can lead to cirrhosis and liver failure. Encourage routine testing; recommend repeat testing if risk factors change.       Relevant Orders   Hepatitis C Antibody   Family history of diabetes mellitus (DM)    Check A1c given HTN, HLD and obesity; coupled with family hx of DM       Relevant Orders   Hemoglobin A1c   Family history of diabetes mellitus in maternal grandfather    Check A1c given HTN, HLD and obesity; coupled with family hx of DM      Relevant Orders   Hemoglobin A1c   Hypercholesteremia    Chronic, elevated  On statin- pravastatin 40 mg QD Not on fish oil; has been working on eating healthier animal fats       Relevant Medications   losartan-hydrochlorothiazide (HYZAAR) 100-25  MG tablet   Hypertriglyceridemia    Chronic, elevated Repeat FLP recommend diet low in saturated fat and regular exercise - 30 min at least 5 times per week       Relevant Medications   losartan-hydrochlorothiazide (HYZAAR) 100-25 MG tablet   Morbid obesity (HCC)    Chronic, stable Body  mass index is 39.71 kg/m. Discussed importance of healthy weight management Discussed diet and exercise Report that he had tried intermittent fasting of 16/8 to assist with weight loss- got down to 288#; however, has regained weight since stopping         Return in about 2 months (around 03/08/2022) for HTN management.     Vonna Kotyk, FNP, have reviewed all documentation for this visit. The documentation on 01/06/22 for the exam, diagnosis, procedures, and orders are all accurate and complete.    James Cooley, [place] [phone] (phone) [phone] (fax)  [place]

## 2022-01-06 ENCOUNTER — Ambulatory Visit (INDEPENDENT_AMBULATORY_CARE_PROVIDER_SITE_OTHER): Payer: BC Managed Care – PPO | Admitting: Family Medicine

## 2022-01-06 ENCOUNTER — Encounter: Payer: Self-pay | Admitting: Family Medicine

## 2022-01-06 VITALS — BP 148/102 | HR 76 | Temp 98.4°F | Resp 16 | Ht 73.0 in | Wt 301.0 lb

## 2022-01-06 DIAGNOSIS — E781 Pure hyperglyceridemia: Secondary | ICD-10-CM | POA: Diagnosis not present

## 2022-01-06 DIAGNOSIS — Z0000 Encounter for general adult medical examination without abnormal findings: Secondary | ICD-10-CM

## 2022-01-06 DIAGNOSIS — E7800 Pure hypercholesterolemia, unspecified: Secondary | ICD-10-CM | POA: Diagnosis not present

## 2022-01-06 DIAGNOSIS — E8881 Metabolic syndrome: Secondary | ICD-10-CM | POA: Diagnosis not present

## 2022-01-06 DIAGNOSIS — E66812 Obesity, class 2: Secondary | ICD-10-CM

## 2022-01-06 DIAGNOSIS — Z1159 Encounter for screening for other viral diseases: Secondary | ICD-10-CM

## 2022-01-06 DIAGNOSIS — Z6839 Body mass index (BMI) 39.0-39.9, adult: Secondary | ICD-10-CM

## 2022-01-06 DIAGNOSIS — I10 Essential (primary) hypertension: Secondary | ICD-10-CM | POA: Diagnosis not present

## 2022-01-06 DIAGNOSIS — Z833 Family history of diabetes mellitus: Secondary | ICD-10-CM

## 2022-01-06 MED ORDER — LOSARTAN POTASSIUM-HCTZ 100-25 MG PO TABS: 1.0000 | ORAL_TABLET | Freq: Every day | ORAL | 0 refills | Status: DC

## 2022-01-06 NOTE — Assessment & Plan Note (Signed)
Chronic, stable Body mass index is 39.71 kg/m. Discussed importance of healthy weight management Discussed diet and exercise Report that he had tried intermittent fasting of 16/8 to assist with weight loss- got down to 288#; however, has regained weight since stopping

## 2022-01-06 NOTE — Assessment & Plan Note (Signed)
UTD on dental and vision Has 35 year old son, 42 year old daughter Reports stressful job Married Things to do to keep yourself healthy  - Exercise at least 30-45 minutes a day, 3-4 days a week.  - Eat a low-fat diet with lots of fruits and vegetables, up to 7-9 servings per day.  - Seatbelts can save your life. Wear them always.  - Smoke detectors on every level of your home, check batteries every year.  - Eye Doctor - have an eye exam every 1-2 years  - Safe sex - if you may be exposed to STDs, use a condom.  - Alcohol -  If you drink, do it moderately, less than 2 drinks per day.  - Health Care Power of Attorney. Choose someone to speak for you if you are not able.  - Depression is common in our stressful world.If you're feeling down or losing interest in things you normally enjoy, please come in for a visit.  - Violence - If anyone is threatening or hurting you, please call immediately.

## 2022-01-06 NOTE — Assessment & Plan Note (Signed)
Chronic, elevated  On statin- pravastatin 40 mg QD Not on fish oil; has been working on eating healthier animal fats

## 2022-01-06 NOTE — Assessment & Plan Note (Signed)
Chronic, elevated; recommend titration to 100 mg of losartan with plan to stop Norvasc at 10 mg to assist , in addition to HCTZ 25 mg Denies CP Denies SOB/ DOE Denies low blood pressure/hypotension Denies vision changes No LE Edema noted on exam; however, reports edema on BLE with travel and with recent beach trip Seek emergent care if you develop chest pain or chest pressure

## 2022-01-06 NOTE — Assessment & Plan Note (Signed)
Check A1c given HTN, HLD and obesity; coupled with family hx of DM 

## 2022-01-06 NOTE — Assessment & Plan Note (Signed)
Chronic, elevated Repeat FLP recommend diet low in saturated fat and regular exercise - 30 min at least 5 times per week

## 2022-01-06 NOTE — Assessment & Plan Note (Signed)
Low risk screen Treatable, and curable. If left untreated Hep C can lead to cirrhosis and liver failure. Encourage routine testing; recommend repeat testing if risk factors change.  

## 2022-01-06 NOTE — Assessment & Plan Note (Signed)
Check A1c given HTN, HLD and obesity; coupled with family hx of DM

## 2022-01-07 ENCOUNTER — Other Ambulatory Visit: Payer: Self-pay

## 2022-01-07 ENCOUNTER — Other Ambulatory Visit: Payer: Self-pay | Admitting: Family Medicine

## 2022-01-07 DIAGNOSIS — E781 Pure hyperglyceridemia: Secondary | ICD-10-CM

## 2022-01-07 DIAGNOSIS — E7800 Pure hypercholesterolemia, unspecified: Secondary | ICD-10-CM

## 2022-01-07 LAB — LIPID PANEL
Chol/HDL Ratio: 8.1 ratio — ABNORMAL HIGH (ref 0.0–5.0)
Cholesterol, Total: 276 mg/dL — ABNORMAL HIGH (ref 100–199)
HDL: 34 mg/dL — ABNORMAL LOW (ref >39)
Triglycerides: 874 mg/dL (ref 0–149)

## 2022-01-07 LAB — COMPREHENSIVE METABOLIC PANEL
ALT: 33 IU/L (ref 0–44)
AST: 24 IU/L (ref 0–40)
Albumin/Globulin Ratio: 1.6 (ref 1.2–2.2)
Albumin: 4.6 g/dL (ref 4.0–5.0)
Alkaline Phosphatase: 96 IU/L (ref 44–121)
BUN/Creatinine Ratio: 12 (ref 9–20)
BUN: 12 mg/dL (ref 6–20)
Bilirubin Total: 0.7 mg/dL (ref 0.0–1.2)
CO2: 23 mmol/L (ref 20–29)
Calcium: 9.1 mg/dL (ref 8.7–10.2)
Chloride: 97 mmol/L (ref 96–106)
Creatinine, Ser: 1 mg/dL (ref 0.76–1.27)
Globulin, Total: 2.8 g/dL (ref 1.5–4.5)
Glucose: 121 mg/dL — ABNORMAL HIGH (ref 70–99)
Potassium: 3.4 mmol/L — ABNORMAL LOW (ref 3.5–5.2)
Sodium: 137 mmol/L (ref 134–144)
Total Protein: 7.4 g/dL (ref 6.0–8.5)
eGFR: 101 mL/min/{1.73_m2} (ref >59)

## 2022-01-07 LAB — CBC WITH DIFFERENTIAL/PLATELET
Basophils Absolute: 0 10*3/uL (ref 0.0–0.2)
Basos: 0 %
EOS (ABSOLUTE): 0.1 10*3/uL (ref 0.0–0.4)
Eos: 1 %
Hematocrit: 45.6 % (ref 37.5–51.0)
Hemoglobin: 15.9 g/dL (ref 13.0–17.7)
Immature Grans (Abs): 0 10*3/uL (ref 0.0–0.1)
Immature Granulocytes: 0 %
Lymphocytes Absolute: 2.3 10*3/uL (ref 0.7–3.1)
Lymphs: 34 %
MCH: 30.5 pg (ref 26.6–33.0)
MCHC: 34.9 g/dL (ref 31.5–35.7)
MCV: 88 fL (ref 79–97)
Monocytes Absolute: 0.7 10*3/uL (ref 0.1–0.9)
Monocytes: 10 %
Neutrophils Absolute: 3.7 10*3/uL (ref 1.4–7.0)
Neutrophils: 55 %
Platelets: 224 10*3/uL (ref 150–450)
RBC: 5.21 x10E6/uL (ref 4.14–5.80)
RDW: 12.5 % (ref 11.6–15.4)
WBC: 6.7 10*3/uL (ref 3.4–10.8)

## 2022-01-07 LAB — TSH+FREE T4
Free T4: 1.24 ng/dL (ref 0.82–1.77)
TSH: 1.51 u[IU]/mL (ref 0.450–4.500)

## 2022-01-07 LAB — HEMOGLOBIN A1C
Est. average glucose Bld gHb Est-mCnc: 120 mg/dL
Hgb A1c MFr Bld: 5.8 % — ABNORMAL HIGH (ref 4.8–5.6)

## 2022-01-07 LAB — HEPATITIS C ANTIBODY: Hep C Virus Ab: NONREACTIVE

## 2022-01-07 MED ORDER — FENOFIBRATE 67 MG PO CAPS
67.0000 mg | ORAL_CAPSULE | Freq: Every day | ORAL | 0 refills | Status: DC
Start: 2022-01-07 — End: 2022-02-12

## 2022-01-07 MED ORDER — PRAVASTATIN SODIUM 40 MG PO TABS: 40.0000 mg | ORAL_TABLET | Freq: Every day | ORAL | 0 refills | Status: DC

## 2022-01-07 MED ORDER — OMEGA-3-ACID ETHYL ESTERS 1 G PO CAPS: 2.0000 g | ORAL_CAPSULE | Freq: Two times a day (BID) | ORAL | 0 refills | Status: DC

## 2022-01-07 NOTE — Progress Notes (Signed)
Re cholesterol -total increased -fats/triglycerides increased; recommend adding fish oil and/or fenofibrate back into routine -hdl remains low (good cholesterol) -unable to calculate ldl (bad cholesterol) due to very high triglycerides; unclear if the statin is needed at this time given no quantifiable levels. The statin is designed to lower LDL; which was stable on previous dose. OK to resume previous dose if you desire, as you were taking medication at last lab check in 2021. I will wait to hear from you.  Re chemistry -elevation seen in fasting blood sugar -borderline low potassium  Re A1c -blood sugar shows prediabetes; can add metformin to assist with progression -Continue to recommend balanced, lower carb meals. Smaller meal size, adding snacks. Choosing water as drink of choice and increasing purposeful exercise.  Re cell count -normal and stable  Re thyroid -normal and stable  Re communicable diseases -negative for Hep C; non-reactive. No need to repeat unless risk factors change.  Jacky Kindle, FNP  [facility] [address] [place], [place] [address] [phone] (phone) [phone] (fax) [facility]

## 2022-02-01 ENCOUNTER — Other Ambulatory Visit: Payer: Self-pay | Admitting: Family Medicine

## 2022-02-01 DIAGNOSIS — E7800 Pure hypercholesterolemia, unspecified: Secondary | ICD-10-CM

## 2022-02-01 DIAGNOSIS — I10 Essential (primary) hypertension: Secondary | ICD-10-CM

## 2022-02-01 DIAGNOSIS — E781 Pure hyperglyceridemia: Secondary | ICD-10-CM

## 2022-02-01 MED ORDER — LOSARTAN POTASSIUM-HCTZ 100-25 MG PO TABS: 1.0000 | ORAL_TABLET | Freq: Every day | ORAL | 3 refills | Status: DC

## 2022-02-01 MED ORDER — AMLODIPINE BESYLATE 10 MG PO TABS: 5.0000 mg | ORAL_TABLET | Freq: Every day | ORAL | 1 refills | Status: DC

## 2022-02-01 MED ORDER — OMEGA-3-ACID ETHYL ESTERS 1 G PO CAPS: 2.0000 g | ORAL_CAPSULE | Freq: Two times a day (BID) | ORAL | 3 refills | Status: DC

## 2022-02-02 ENCOUNTER — Other Ambulatory Visit: Payer: Self-pay | Admitting: Family Medicine

## 2022-02-02 DIAGNOSIS — E781 Pure hyperglyceridemia: Secondary | ICD-10-CM

## 2022-02-02 DIAGNOSIS — E7800 Pure hypercholesterolemia, unspecified: Secondary | ICD-10-CM

## 2022-02-02 MED ORDER — PRAVASTATIN SODIUM 40 MG PO TABS: 40.0000 mg | ORAL_TABLET | Freq: Every day | ORAL | 3 refills | Status: DC

## 2022-02-05 ENCOUNTER — Other Ambulatory Visit: Payer: Self-pay | Admitting: Family Medicine

## 2022-02-05 DIAGNOSIS — E7800 Pure hypercholesterolemia, unspecified: Secondary | ICD-10-CM

## 2022-02-05 DIAGNOSIS — E781 Pure hyperglyceridemia: Secondary | ICD-10-CM

## 2022-02-08 NOTE — Telephone Encounter (Signed)
Called pt - [facility]  - pt has new insurance.

## 2022-02-08 NOTE — Telephone Encounter (Signed)
Pt has new insurance. [facility]. Requested Prescriptions  Pending Prescriptions Disp Refills  . pravastatin (PRAVACHOL) 40 MG tablet [Pharmacy Med Name: PRAVASTATIN TABS 40MG ] 30 tablet 11    Sig: TAKE 1 TABLET DAILY (NEED APPOINTMENT FOR FURTHER REFILLS)     Cardiovascular:  Antilipid - Statins Failed - 02/05/2022  7:14 PM      Failed - Lipid Panel in normal range within the last 12 months    Cholesterol, Total  Date Value Ref Range Status  01/06/2022 276 (H) 100 - 199 mg/dL Final   LDL Chol Calc (NIH)  Date Value Ref Range Status  01/06/2022 Comment (A) 0 - 99 mg/dL Final    Comment:    Triglyceride result indicated is too high for an accurate LDL cholesterol estimation.    HDL  Date Value Ref Range Status  01/06/2022 34 (L) >39 mg/dL Final   Triglycerides  Date Value Ref Range Status  01/06/2022 874 (HH) 0 - 149 mg/dL Final         Passed - Patient is not pregnant      Passed - Valid encounter within last 12 months    Recent Outpatient Visits          1 month ago Annual physical exam   [facility] [place] [facility], FNP   1 year ago Annual physical exam   [facility] [place], [facility], PA-C   1 year ago Atypical pneumonia   [facility] [place], [place], [place]   1 year ago Fever, unspecified fever cause   [facility], [facility], [place]andra [place]   2 years ago Annual physical exam   [facility] M, M

## 2022-02-12 ENCOUNTER — Encounter: Payer: Self-pay | Admitting: Family Medicine

## 2022-02-12 ENCOUNTER — Ambulatory Visit (INDEPENDENT_AMBULATORY_CARE_PROVIDER_SITE_OTHER): Payer: BC Managed Care – PPO | Admitting: Family Medicine

## 2022-02-12 VITALS — BP 146/102 | HR 90

## 2022-02-12 DIAGNOSIS — I10 Essential (primary) hypertension: Secondary | ICD-10-CM | POA: Insufficient documentation

## 2022-02-12 MED ORDER — AMLODIPINE BESYLATE 5 MG PO TABS: ORAL_TABLET | ORAL | 0 refills | Status: DC

## 2022-02-12 NOTE — Progress Notes (Signed)
Hypertension follow-up with CMA; Blood pressure remains above goal of <140/<90. No other vital signs obtained today. Additional of low dose calcium channel blocker added to current regimen for best protection from heart attack, stroke, end organ damage.  Jacky Kindle, FNP  [facility] [address] [place], [place] [address] [phone] (phone) [phone] (fax) [facility]

## 2022-03-23 ENCOUNTER — Encounter: Payer: Self-pay | Admitting: Family Medicine

## 2022-03-26 NOTE — Progress Notes (Unsigned)
Established patient visit   Patient: James Cooley   DOB: 01/22/1986   36 y.o. Male  MRN: [id number] Visit Date: 03/29/2022  Today's healthcare provider: Jacky Kindle, FNP  RE Introduced to nurse practitioner role and practice setting.  All questions answered.  Discussed provider/patient relationship and expectations.  I,Tiffany J Bragg,acting as a scribe for Jacky Kindle, FNP.,have documented all relevant documentation on the behalf of Jacky Kindle, FNP,as directed by  Jacky Kindle, FNP while in the presence of Jacky Kindle, FNP.   Chief Complaint  Patient presents with   Hypertension   Subjective    HPI  Hypertension, follow-up  BP Readings from Last 3 Encounters:  03/29/22 (!) 140/98  02/12/22 (!) 146/102  01/06/22 (!) 148/102   Wt Readings from Last 3 Encounters:  03/29/22 (!) 303 lb (137.4 kg)  01/06/22 (!) 301 lb (136.5 kg)  12/16/20 (!) 307 lb 12.8 oz (139.6 kg)     He was last seen for hypertension 2 months ago.  BP at that visit was 148/102. Management since that visit includes recommend titration to 100 mg of losartan with plan to stop Norvasc at 10 mg to assist , in addition to HCTZ 25 mg.  He reports excellent compliance with treatment. He is not having side effects.  He is following a  intermittent fasting  diet. He is exercising. He does not smoke.  Outside blood pressures are around 142/98. Symptoms: No chest pain No chest pressure  No palpitations No syncope  No dyspnea No orthopnea  No paroxysmal nocturnal dyspnea No lower extremity edema   Pertinent labs Lab Results  Component Value Date   CHOL 276 (H) 01/06/2022   HDL 34 (L) 01/06/2022   LDLCALC Comment (A) 01/06/2022   TRIG 874 (HH) 01/06/2022   CHOLHDL 8.1 (H) 01/06/2022   Lab Results  Component Value Date   NA 137 01/06/2022   K 3.4 (L) 01/06/2022   CREATININE 1.00 01/06/2022   EGFR 101 01/06/2022   GLUCOSE 121 (H) 01/06/2022   TSH 1.510 01/06/2022     The ASCVD Risk  score (Arnett DK, et al., 2019) failed to calculate for the following reasons:   The 2019 ASCVD risk score is only valid for ages 23 to 59  ---------------------------------------------------------------------------------------------------   Medications: Outpatient Medications Prior to Visit  Medication Sig   losartan-hydrochlorothiazide (HYZAAR) 100-25 MG tablet Take 1 tablet by mouth daily.   omega-3 acid ethyl esters (LOVAZA) 1 g capsule Take 2 capsules (2 g total) by mouth 2 (two) times daily.   pravastatin (PRAVACHOL) 40 MG tablet TAKE 1 TABLET DAILY (NEED APPOINTMENT FOR FURTHER REFILLS)   [DISCONTINUED] amLODipine (NORVASC) 5 MG tablet Take 1/2 tablet, 2.5 mg PO, for 2 weeks. Start 1 tablet, 5 mg PO daily. Return in 6 weeks for appt.   No facility-administered medications prior to visit.    Review of Systems  Last CBC Lab Results  Component Value Date   WBC 6.7 01/06/2022   HGB 15.9 01/06/2022   HCT 45.6 01/06/2022   MCV 88 01/06/2022   MCH 30.5 01/06/2022   RDW 12.5 01/06/2022   PLT 224 01/06/2022   Last metabolic panel Lab Results  Component Value Date   GLUCOSE 121 (H) 01/06/2022   NA 137 01/06/2022   K 3.4 (L) 01/06/2022   CL 97 01/06/2022   CO2 23 01/06/2022   BUN 12 01/06/2022   CREATININE 1.00 01/06/2022   EGFR 101 01/06/2022   CALCIUM  9.1 01/06/2022   PROT 7.4 01/06/2022   ALBUMIN 4.6 01/06/2022   LABGLOB 2.8 01/06/2022   AGRATIO 1.6 01/06/2022   BILITOT 0.7 01/06/2022   ALKPHOS 96 01/06/2022   AST 24 01/06/2022   ALT 33 01/06/2022   Last lipids Lab Results  Component Value Date   CHOL 276 (H) 01/06/2022   HDL 34 (L) 01/06/2022   LDLCALC Comment (A) 01/06/2022   TRIG 874 (HH) 01/06/2022   CHOLHDL 8.1 (H) 01/06/2022   Last hemoglobin A1c Lab Results  Component Value Date   HGBA1C 5.8 (H) 01/06/2022   Last thyroid functions Lab Results  Component Value Date   TSH 1.510 01/06/2022       Objective    BP (!) 140/98 (BP Location: Right  Arm, Patient Position: Sitting, Cuff Size: Large)   Pulse 76   Resp 16   Ht $R'6\' 2"'eq$  (1.88 m)   Wt (!) 303 lb (137.4 kg)   SpO2 99%   BMI 38.90 kg/m   BP Readings from Last 3 Encounters:  03/29/22 (!) 140/98  02/12/22 (!) 146/102  01/06/22 (!) 148/102   Wt Readings from Last 3 Encounters:  03/29/22 (!) 303 lb (137.4 kg)  01/06/22 (!) 301 lb (136.5 kg)  12/16/20 (!) 307 lb 12.8 oz (139.6 kg)   SpO2 Readings from Last 3 Encounters:  03/29/22 99%  02/12/22 97%  01/06/22 98%   Physical Exam Vitals and nursing note reviewed.  Constitutional:      Appearance: Normal appearance. He is obese.  HENT:     Head: Normocephalic and atraumatic.  Eyes:     Pupils: Pupils are equal, round, and reactive to light.  Cardiovascular:     Rate and Rhythm: Normal rate and regular rhythm.     Pulses: Normal pulses.     Heart sounds: Normal heart sounds.  Pulmonary:     Effort: Pulmonary effort is normal.     Breath sounds: Normal breath sounds.  Musculoskeletal:        General: Normal range of motion.  Skin:    General: Skin is warm and dry.     Capillary Refill: Capillary refill takes less than 2 seconds.  Neurological:     General: No focal deficit present.     Mental Status: He is alert and oriented to person, place, and time. Mental status is at baseline.  Psychiatric:        Mood and Affect: Mood normal.        Behavior: Behavior normal.        Thought Content: Thought content normal.        Judgment: Judgment normal.      No results found for any visits on 03/29/22.  Assessment & Plan     Problem List Items Addressed This Visit       Cardiovascular and Mediastinum   Essential hypertension - Primary    Continue titration of norvasc to 7.5 mg with 4 week follow up to prevent LE edema Continue hyzaar; 100-25 Continue focus on diet/exercise- pt mentioned stopping ETOH use for 2 weeks.  Exam normal; home BP correlating 140/92 this morning (his wife checks)      Relevant  Medications   amLODipine (NORVASC) 5 MG tablet     Return in about 4 weeks (around 04/26/2022) for blood pressure check, HTN management.      Vonna Kotyk, FNP, have reviewed all documentation for this visit. The documentation on 03/29/22 for the exam, diagnosis, procedures, and orders  are all accurate and complete.    Gwyneth Sprout, [place] [phone] (phone) [phone] (fax)  [place]

## 2022-03-29 ENCOUNTER — Encounter: Payer: Self-pay | Admitting: Family Medicine

## 2022-03-29 ENCOUNTER — Ambulatory Visit (INDEPENDENT_AMBULATORY_CARE_PROVIDER_SITE_OTHER): Payer: BC Managed Care – PPO | Admitting: Family Medicine

## 2022-03-29 VITALS — BP 140/98 | HR 76 | Resp 16 | Ht 74.0 in | Wt 303.0 lb

## 2022-03-29 DIAGNOSIS — I10 Essential (primary) hypertension: Secondary | ICD-10-CM

## 2022-03-29 MED ORDER — AMLODIPINE BESYLATE 5 MG PO TABS: 7.5000 mg | ORAL_TABLET | Freq: Every day | ORAL | 0 refills | Status: DC

## 2022-03-29 NOTE — Assessment & Plan Note (Signed)
Continue titration of norvasc to 7.5 mg with 4 week follow up to prevent LE edema Continue hyzaar; 100-25 Continue focus on diet/exercise- pt mentioned stopping ETOH use for 2 weeks.  Exam normal; home BP correlating 140/92 this morning (his wife checks)

## 2022-04-16 DIAGNOSIS — E66812 Obesity, class 2: Secondary | ICD-10-CM | POA: Insufficient documentation

## 2022-04-16 DIAGNOSIS — E8881 Metabolic syndrome: Secondary | ICD-10-CM | POA: Insufficient documentation

## 2022-04-16 NOTE — Assessment & Plan Note (Signed)
Apple shape; elevated waist measurement BMI 39.71 HTN HLD Elevated Cholesterol No personal hx of DM; strong family hx  Discussed importance of healthy weight management Discussed diet and exercise

## 2022-04-16 NOTE — Assessment & Plan Note (Signed)
Chronic, worsening; got to 288# with 16/8 intermittent fasting BMI 39.71 with associated HTN, HLD, elevated cholesterol Continue to recommend balanced, lower carb meals. Smaller meal size, adding snacks. Choosing water as drink of choice and increasing purposeful exercise.

## 2022-04-28 ENCOUNTER — Encounter: Payer: Self-pay | Admitting: Family Medicine

## 2022-04-28 ENCOUNTER — Ambulatory Visit: Payer: BC Managed Care – PPO | Admitting: Family Medicine

## 2022-04-28 VITALS — BP 146/103 | HR 77 | Resp 16 | Ht 74.0 in | Wt 300.0 lb

## 2022-04-28 DIAGNOSIS — I10 Essential (primary) hypertension: Secondary | ICD-10-CM | POA: Diagnosis not present

## 2022-04-28 MED ORDER — AMLODIPINE BESYLATE 10 MG PO TABS: 10.0000 mg | ORAL_TABLET | Freq: Every day | ORAL | 3 refills | Status: DC

## 2022-04-28 NOTE — Assessment & Plan Note (Signed)
Chronic, continues to improve on home readings 130s/80s this morning when he checked Has started IF again- 5 days a week; also did 1 month without ETOH, now only drinking ETOH on Friday/Saturday and less than before Is riding peloton 2x/week for 15 mins Continue to recommend titration of Norvasc to 10 mg given no complaints of LE edema or swelling in addition to Hyzaar 100-25

## 2022-04-28 NOTE — Progress Notes (Signed)
Established patient visit   Patient: James Cooley   DOB: 1985-10-18   36 y.o. Male  MRN: [id number] Visit Date: 04/28/2022  Today's healthcare provider: Gwyneth Sprout, FNP  Introduced to nurse practitioner role and practice setting.  All questions answered.  Discussed provider/patient relationship and expectations.  I,Tiffany J Bragg,acting as a scribe for Gwyneth Sprout, FNP.,have documented all relevant documentation on the behalf of Gwyneth Sprout, FNP,as directed by  Gwyneth Sprout, FNP while in the presence of Gwyneth Sprout, FNP.   Chief Complaint  Patient presents with   Hypertension   Subjective    HPI  Hypertension, follow-up  BP Readings from Last 3 Encounters:  04/28/22 (!) 146/103  03/29/22 (!) 140/98  02/12/22 (!) 146/102   Wt Readings from Last 3 Encounters:  04/28/22 300 lb (136.1 kg)  03/29/22 (!) 303 lb (137.4 kg)  01/06/22 (!) 301 lb (136.5 kg)     He was last seen for hypertension 1 months ago.  BP at that visit was 140/98. Management since that visit includes continue medications.  He reports excellent compliance with treatment. He is not having side effects.  He is following a Regular diet. He is exercising. He does not smoke.  Use of agents associated with hypertension: none.   Outside blood pressures are around 130's/90 Symptoms: No chest pain No chest pressure  No palpitations No syncope  No dyspnea No orthopnea  Yes paroxysmal nocturnal dyspnea No lower extremity edema   Pertinent labs Lab Results  Component Value Date   CHOL 276 (H) 01/06/2022   HDL 34 (L) 01/06/2022   LDLCALC Comment (A) 01/06/2022   TRIG 874 (HH) 01/06/2022   CHOLHDL 8.1 (H) 01/06/2022   Lab Results  Component Value Date   NA 137 01/06/2022   K 3.4 (L) 01/06/2022   CREATININE 1.00 01/06/2022   EGFR 101 01/06/2022   GLUCOSE 121 (H) 01/06/2022   TSH 1.510 01/06/2022     The ASCVD Risk score (Arnett DK, et al., 2019) failed to calculate for the  following reasons:   The 2019 ASCVD risk score is only valid for ages 62 to 77  ---------------------------------------------------------------------------------------------------   Medications: Outpatient Medications Prior to Visit  Medication Sig   losartan-hydrochlorothiazide (HYZAAR) 100-25 MG tablet Take 1 tablet by mouth daily.   omega-3 acid ethyl esters (LOVAZA) 1 g capsule Take 2 capsules (2 g total) by mouth 2 (two) times daily.   pravastatin (PRAVACHOL) 40 MG tablet TAKE 1 TABLET DAILY (NEED APPOINTMENT FOR FURTHER REFILLS)   [DISCONTINUED] amLODipine (NORVASC) 5 MG tablet Take 1.5 tablets (7.5 mg total) by mouth daily. Take 1.5 tablets daily; follow up in 4 weeks Goal 130/80   No facility-administered medications prior to visit.    Review of Systems    Objective    BP (!) 146/103 (BP Location: Right Arm, Patient Position: Sitting, Cuff Size: Large)   Pulse 77   Resp 16   Ht $R'6\' 2"'US$  (1.88 m)   Wt 300 lb (136.1 kg)   SpO2 100%   BMI 38.52 kg/m   Physical Exam Vitals and nursing note reviewed.  Constitutional:      Appearance: Normal appearance. He is obese.  HENT:     Head: Normocephalic and atraumatic.  Cardiovascular:     Rate and Rhythm: Normal rate and regular rhythm.     Pulses: Normal pulses.     Heart sounds: Normal heart sounds.  Pulmonary:     Effort:  Pulmonary effort is normal.     Breath sounds: Normal breath sounds.  Musculoskeletal:        General: Normal range of motion.     Cervical back: Normal range of motion.     Right lower leg: No edema.     Left lower leg: No edema.  Skin:    General: Skin is warm and dry.     Capillary Refill: Capillary refill takes less than 2 seconds.  Neurological:     General: No focal deficit present.     Mental Status: He is alert and oriented to person, place, and time. Mental status is at baseline.  Psychiatric:        Mood and Affect: Mood normal.        Behavior: Behavior normal.        Thought Content:  Thought content normal.        Judgment: Judgment normal.      No results found for any visits on 04/28/22.  Assessment & Plan     Problem List Items Addressed This Visit       Cardiovascular and Mediastinum   Essential hypertension - Primary    Chronic, continues to improve on home readings 130s/80s this morning when he checked Has started IF again- 5 days a week; also did 1 month without ETOH, now only drinking ETOH on Friday/Saturday and less than before Is riding peloton 2x/week for 15 mins Continue to recommend titration of Norvasc to 10 mg given no complaints of LE edema or swelling in addition to Hyzaar 100-25      Relevant Medications   amLODipine (NORVASC) 10 MG tablet     Return in about 3 months (around 07/29/2022) for chonic disease management.      Vonna Kotyk, FNP, have reviewed all documentation for this visit. The documentation on 04/28/22 for the exam, diagnosis, procedures, and orders are all accurate and complete.    Gwyneth Sprout, [place] [phone] (phone) [phone] (fax)  [place]

## 2023-01-12 ENCOUNTER — Other Ambulatory Visit: Payer: Self-pay | Admitting: Family Medicine

## 2023-01-12 DIAGNOSIS — I10 Essential (primary) hypertension: Secondary | ICD-10-CM

## 2023-01-31 ENCOUNTER — Other Ambulatory Visit: Payer: Self-pay | Admitting: Family Medicine

## 2023-01-31 DIAGNOSIS — E781 Pure hyperglyceridemia: Secondary | ICD-10-CM

## 2023-01-31 DIAGNOSIS — E7800 Pure hypercholesterolemia, unspecified: Secondary | ICD-10-CM

## 2023-02-01 NOTE — Telephone Encounter (Signed)
Requested medications are due for refill today.  yes  Requested medications are on the active medications list.  yes  Last refill. 02/08/2022 #90 3 rf  Future visit scheduled.   no  Notes to clinic.  Labs are expired.    Requested Prescriptions  Pending Prescriptions Disp Refills   pravastatin (PRAVACHOL) 40 MG tablet [Pharmacy Med Name: PRAVASTATIN TABS 40MG ] 90 tablet 3    Sig: TAKE 1 TABLET DAILY (NEED APPOINTMENT FOR FURTHER REFILLS)     Cardiovascular:  Antilipid - Statins Failed - 01/31/2023  2:07 AM      Failed - Lipid Panel in normal range within the last 12 months    Cholesterol, Total  Date Value Ref Range Status  01/06/2022 276 (H) 100 - 199 mg/dL Final   LDL Chol Calc (NIH)  Date Value Ref Range Status  01/06/2022 Comment (A) 0 - 99 mg/dL Final    Comment:    Triglyceride result indicated is too high for an accurate LDL cholesterol estimation.    HDL  Date Value Ref Range Status  01/06/2022 34 (L) >39 mg/dL Final   Triglycerides  Date Value Ref Range Status  01/06/2022 874 (HH) 0 - 149 mg/dL Final         Passed - Patient is not pregnant      Passed - Valid encounter within last 12 months    Recent Outpatient Visits           9 months ago Essential hypertension   [place] [facility] Jacky Kindle, FNP   10 months ago Essential hypertension   [place] [facility] Jacky Kindle, FNP   1 year ago Annual physical exam   [place] [facility] Jacky Kindle, FNP   2 years ago Annual physical exam   [facility] Chrismon, Jodell Cipro, PA-C   2 years ago Atypical pneumonia   [facility] [place], [place], [place]

## 2023-04-06 ENCOUNTER — Ambulatory Visit: Payer: BC Managed Care – PPO | Admitting: Family Medicine

## 2023-04-12 ENCOUNTER — Other Ambulatory Visit: Payer: Self-pay | Admitting: Family Medicine

## 2023-04-12 DIAGNOSIS — I10 Essential (primary) hypertension: Secondary | ICD-10-CM

## 2023-04-12 NOTE — Telephone Encounter (Signed)
Requested medications are due for refill today.  yes  Requested medications are on the active medications list.  yes  Last refill. 01/12/2023 #90 0 rf  Future visit scheduled.   yes  Notes to clinic.  Labs are expired. Pt is more than 3 months overdue for ov.    Requested Prescriptions  Pending Prescriptions Disp Refills   losartan-hydrochlorothiazide (HYZAAR) 100-25 MG tablet [Pharmacy Med Name: LOSARTAN/ HYDROCHLOROTHIAZIDE TABS 100/25MG ] 90 tablet 3    Sig: TAKE 1 TABLET DAILY (PLEASE SCHEDULE OFFICE VISIT BEFORE ANY FUTURE REFILL)     Cardiovascular: ARB + Diuretic Combos Failed - 04/12/2023  1:38 AM      Failed - K in normal range and within 180 days    Potassium  Date Value Ref Range Status  01/06/2022 3.4 (L) 3.5 - 5.2 mmol/L Final         Failed - Na in normal range and within 180 days    Sodium  Date Value Ref Range Status  01/06/2022 137 134 - 144 mmol/L Final         Failed - Cr in normal range and within 180 days    Creatinine, Ser  Date Value Ref Range Status  01/06/2022 1.00 0.76 - 1.27 mg/dL Final         Failed - eGFR is 10 or above and within 180 days    GFR calc Af Amer  Date Value Ref Range Status  07/25/2019 102 >59 mL/min/1.73 Final   GFR calc non Af Amer  Date Value Ref Range Status  07/25/2019 88 >59 mL/min/1.73 Final   eGFR  Date Value Ref Range Status  01/06/2022 101 >59 mL/min/1.73 Final         Failed - Last BP in normal range    BP Readings from Last 1 Encounters:  04/28/22 (!) 146/103         Failed - Valid encounter within last 6 months    Recent Outpatient Visits           11 months ago Essential hypertension   [place] [facility] Jacky Kindle, FNP   1 year ago Essential hypertension   [place] [facility] Jacky Kindle, FNP   1 year ago Annual physical exam   [facility] Jacky Kindle, FNP   2 years ago Annual physical exam   [facility] Chrismon, Jodell Cipro, PA-C   2 years ago Atypical pneumonia   [facility] [place], [place], [place]       Future Appointments             In 1 month Suzie Portela, Daryl [place], FNP [place] [facility], [facility]            Passed - Patient is not pregnant

## 2023-04-16 IMAGING — DX DG CHEST 2V
2 series · 2 of 2 positions shown · non-contrast
Comparison: 07/16/2020

CLINICAL DATA: Cough, shortness of breath

EXAM:
CHEST - 2 VIEW

[chest pa]
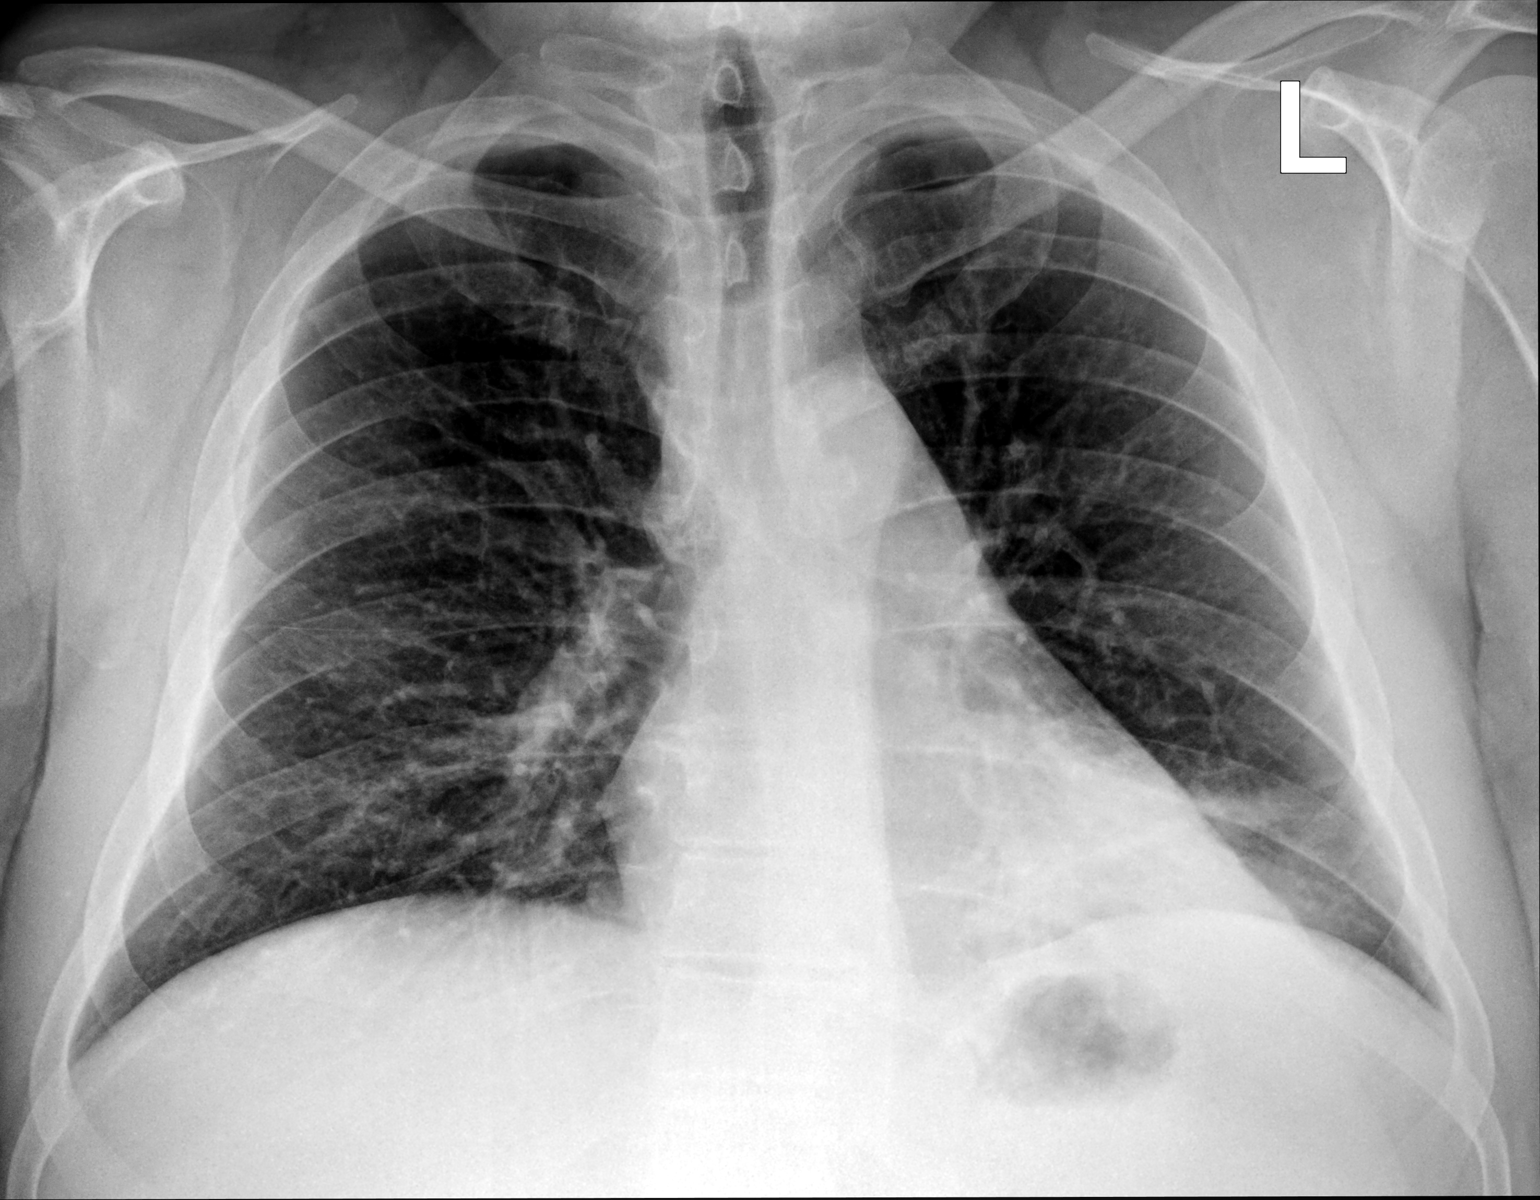

[chest lat]
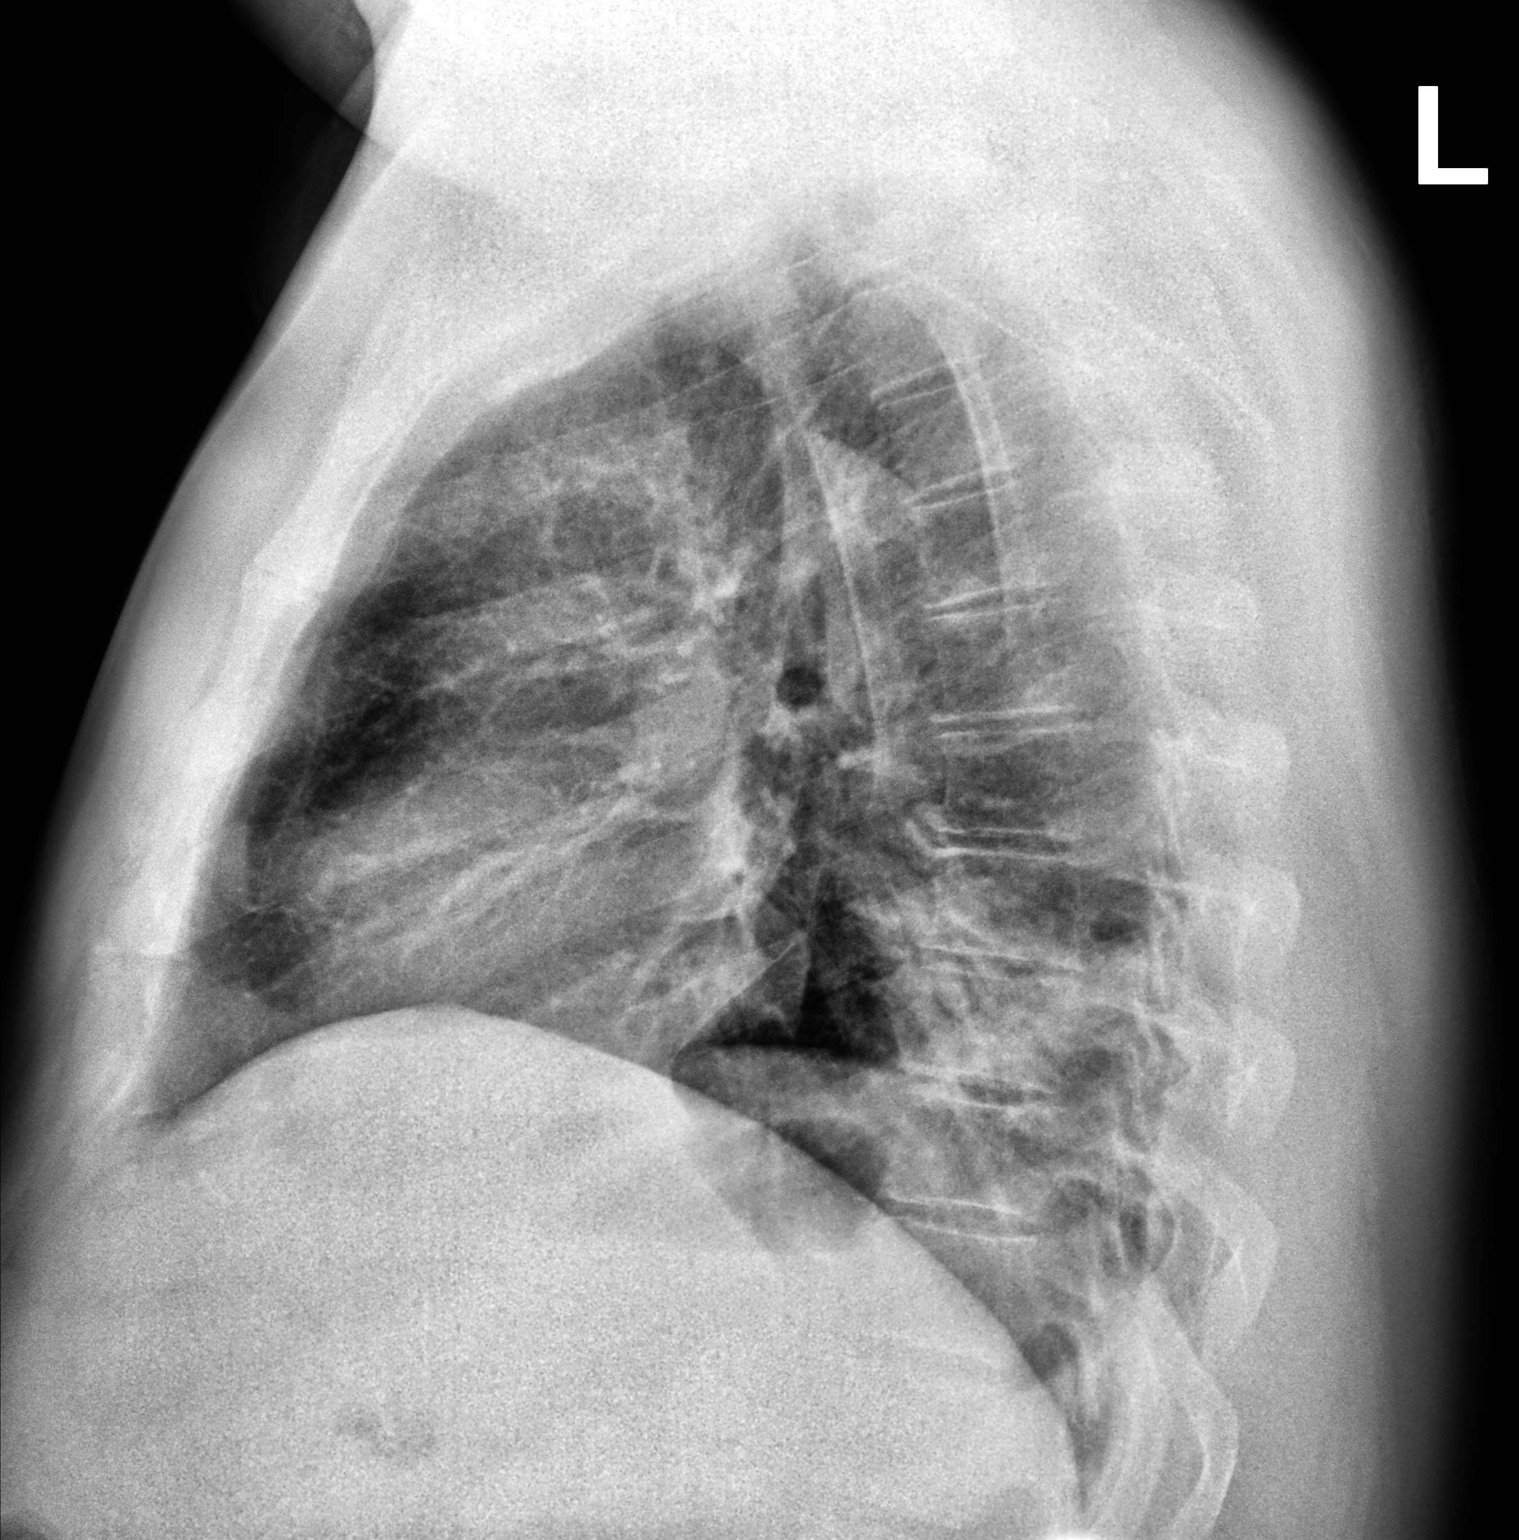

[2 of 2 positions shown; findings below may reference images not displayed]

FINDINGS: The heart size and mediastinal contours are within normal limits.
New left lower lobe airspace opacity. Right lung is clear. No
pleural effusion or pneumothorax. The visualized skeletal structures
are unremarkable.
IMPRESSION: Left lower lobe pneumonia.

## 2023-05-12 ENCOUNTER — Ambulatory Visit (INDEPENDENT_AMBULATORY_CARE_PROVIDER_SITE_OTHER): Payer: BC Managed Care – PPO | Admitting: Family Medicine

## 2023-05-12 ENCOUNTER — Encounter: Payer: Self-pay | Admitting: Family Medicine

## 2023-05-12 VITALS — BP 133/88 | HR 80 | Temp 98.1°F | Ht 74.0 in | Wt 311.0 lb

## 2023-05-12 DIAGNOSIS — F39 Unspecified mood [affective] disorder: Secondary | ICD-10-CM

## 2023-05-12 DIAGNOSIS — I10 Essential (primary) hypertension: Secondary | ICD-10-CM

## 2023-05-12 DIAGNOSIS — Z0000 Encounter for general adult medical examination without abnormal findings: Secondary | ICD-10-CM | POA: Diagnosis not present

## 2023-05-12 DIAGNOSIS — E781 Pure hyperglyceridemia: Secondary | ICD-10-CM

## 2023-05-12 DIAGNOSIS — E7800 Pure hypercholesterolemia, unspecified: Secondary | ICD-10-CM

## 2023-05-12 DIAGNOSIS — R0681 Apnea, not elsewhere classified: Secondary | ICD-10-CM

## 2023-05-12 DIAGNOSIS — Z6839 Body mass index (BMI) 39.0-39.9, adult: Secondary | ICD-10-CM

## 2023-05-12 DIAGNOSIS — E8881 Metabolic syndrome: Secondary | ICD-10-CM

## 2023-05-12 DIAGNOSIS — Z833 Family history of diabetes mellitus: Secondary | ICD-10-CM

## 2023-05-12 DIAGNOSIS — E66812 Obesity, class 2: Secondary | ICD-10-CM

## 2023-05-12 DIAGNOSIS — R0683 Snoring: Secondary | ICD-10-CM

## 2023-05-12 MED ORDER — CITALOPRAM HYDROBROMIDE 20 MG PO TABS: 20.0000 mg | ORAL_TABLET | Freq: Every day | ORAL | 0 refills | Status: DC

## 2023-05-12 MED ORDER — PRAVASTATIN SODIUM 40 MG PO TABS: 40.0000 mg | ORAL_TABLET | Freq: Every day | ORAL | 3 refills | Status: DC

## 2023-05-12 MED ORDER — HYDRALAZINE HCL 25 MG PO TABS: 25.0000 mg | ORAL_TABLET | Freq: Three times a day (TID) | ORAL | 0 refills | Status: DC

## 2023-05-12 NOTE — Assessment & Plan Note (Signed)

## 2023-05-12 NOTE — Assessment & Plan Note (Signed)
Chronic; uncontrolled-review of home logs continues to show SBP 130-150s with DBP high 80-110s Goal of 119/79 Will add hydralazine to assist Will f/u at 6 week f/u for mood

## 2023-05-12 NOTE — Assessment & Plan Note (Signed)
Chronic, worsening Body mass index is 39.93 kg/m. Discussed importance of healthy weight management Discussed diet and exercise

## 2023-05-12 NOTE — Assessment & Plan Note (Signed)
Reports wife elbows him with concerns of gasping for breath/witnessed apnea; worse when he drinks >2 alcoholic drinks/day Recommend sleep study

## 2023-05-12 NOTE — Assessment & Plan Note (Signed)
Chronic, worsening +Stop Bang +[place]  Notes family hx of OSA and CPAP use

## 2023-05-12 NOTE — Assessment & Plan Note (Signed)
Chronic, worsening.

## 2023-05-12 NOTE — Assessment & Plan Note (Signed)
Chronic, repeat LP Continue medications

## 2023-05-12 NOTE — Assessment & Plan Note (Signed)
Chronic; repeat LP Goal of trigs <150 and LDL <100 Continue medications

## 2023-05-12 NOTE — Progress Notes (Signed)
Complete physical exam   Patient: James Cooley   DOB: Oct 10, 1985   37 y.o. Male  MRN: [id number] Visit Date: 05/12/2023  Today's healthcare provider: Jacky Kindle, FNP  Re Introduced to nurse practitioner role and practice setting.  All questions answered.  Discussed provider/patient relationship and expectations.  Chief Complaint  Patient presents with   Annual Exam    Patient would also like to discuss his blood pressure, cholesterol and anxiety.    Subjective    James Cooley is a 37 y.o. male who presents today for a complete physical exam.  He reports consuming a general diet. The patient does not participate in regular exercise at present. He generally feels fairly well. He reports sleeping poorly. He does have additional problems to discuss today.  HPI HPI     Annual Exam    Additional comments: Patient would also like to discuss his blood pressure, cholesterol and anxiety.       Last edited by Adline Peals, CMA on 05/12/2023  8:46 AM.      Past Medical History:  Diagnosis Date   Asthma    Past Surgical History:  Procedure Laterality Date   NO PAST SURGERIES     Social History   Socioeconomic History   Marital status: Married    Spouse name: Not on file   Number of children: Not on file   Years of education: Not on file   Highest education level: Doctorate  Occupational History   Not on file  Tobacco Use   Smoking status: Never   Smokeless tobacco: Never  Vaping Use   Vaping status: Never Used  Substance and Sexual Activity   Alcohol use: Yes    Comment: 1 per day   Drug use: No   Sexual activity: Yes  Other Topics Concern   Not on file  Social History Narrative   Not on file   Social Determinants of Health   Financial Resource Strain: Low Risk  (05/11/2023)   Overall Financial Resource Strain (CARDIA)    Difficulty of Paying Living Expenses: Not hard at all  Food Insecurity: No Food Insecurity (05/11/2023)   Hunger Vital  Sign    Worried About Running Out of Food in the Last Year: Never true    Ran Out of Food in the Last Year: Never true  Transportation Needs: No Transportation Needs (05/11/2023)   PRAPARE - Administrator, Civil Service (Medical): No    Lack of Transportation (Non-Medical): No  Physical Activity: Insufficiently Active (05/11/2023)   Exercise Vital Sign    Days of Exercise per Week: 1 day    Minutes of Exercise per Session: 10 min  Stress: Stress Concern Present (05/11/2023)   [employer] of Occupational Health - Occupational Stress Questionnaire    Feeling of Stress : To some extent  Social Connections: Moderately Integrated (05/11/2023)   Social Connection and Isolation Panel [NHANES]    Frequency of Communication with Friends and Family: Twice a week    Frequency of Social Gatherings with Friends and Family: Twice a week    Attends Religious Services: 1 to 4 times per year    Active Member of [employer] or Organizations: No    Attends Engineer, structural: Not on file    Marital Status: Married  Catering manager Violence: Not on file   Family Status  Relation Name Status   Mother  Deceased at age 85       breast  cancer, pneumonia   Father  Alive   MGM  Alive   PGM  (Not Specified)   Brother  (Not Specified)   MGF  Deceased  No partnership data on file   Family History  Problem Relation Age of Onset   Breast cancer Mother    Pneumonia Mother    Hypertension Father    Sleep apnea Father    Depression Paternal Grandmother    Mental illness Paternal Grandmother    Sleep apnea Brother    Diabetes Maternal Grandfather    Lung cancer Maternal Grandfather    Allergies  Allergen Reactions   Amoxicillin Rash    Patient Care Team: Jacky Kindle, FNP as PCP - General (Family Medicine)   Medications: Outpatient Medications Prior to Visit  Medication Sig   amLODipine (NORVASC) 10 MG tablet Take 1 tablet (10 mg total) by mouth daily.    losartan-hydrochlorothiazide (HYZAAR) 100-25 MG tablet TAKE 1 TABLET DAILY (PLEASE SCHEDULE OFFICE VISIT BEFORE ANY FUTURE REFILL)   omega-3 acid ethyl esters (LOVAZA) 1 g capsule Take 2 capsules (2 g total) by mouth 2 (two) times daily.   pravastatin (PRAVACHOL) 40 MG tablet TAKE 1 TABLET DAILY (NEED APPOINTMENT FOR FURTHER REFILLS)   No facility-administered medications prior to visit.    Review of Systems Last CBC Lab Results  Component Value Date   WBC 6.7 01/06/2022   HGB 15.9 01/06/2022   HCT 45.6 01/06/2022   MCV 88 01/06/2022   MCH 30.5 01/06/2022   RDW 12.5 01/06/2022   PLT 224 01/06/2022   Last metabolic panel Lab Results  Component Value Date   GLUCOSE 121 (H) 01/06/2022   NA 137 01/06/2022   K 3.4 (L) 01/06/2022   CL 97 01/06/2022   CO2 23 01/06/2022   BUN 12 01/06/2022   CREATININE 1.00 01/06/2022   EGFR 101 01/06/2022   CALCIUM 9.1 01/06/2022   PROT 7.4 01/06/2022   ALBUMIN 4.6 01/06/2022   LABGLOB 2.8 01/06/2022   AGRATIO 1.6 01/06/2022   BILITOT 0.7 01/06/2022   ALKPHOS 96 01/06/2022   AST 24 01/06/2022   ALT 33 01/06/2022   Last lipids Lab Results  Component Value Date   CHOL 276 (H) 01/06/2022   HDL 34 (L) 01/06/2022   LDLCALC Comment (A) 01/06/2022   TRIG 874 (HH) 01/06/2022   CHOLHDL 8.1 (H) 01/06/2022   Last hemoglobin A1c Lab Results  Component Value Date   HGBA1C 5.8 (H) 01/06/2022   Last thyroid functions Lab Results  Component Value Date   TSH 1.510 01/06/2022       Objective    BP (!) 157/114 (BP Location: Right Arm, Patient Position: Sitting, Cuff Size: Large)   Pulse 80   Temp 98.1 F (36.7 C) (Oral)   Ht 6\' 2"  (1.88 m)   Wt (!) 311 lb (141.1 kg)   SpO2 99%   BMI 39.93 kg/m  BP Readings from Last 3 Encounters:  05/12/23 133/88  04/28/22 (!) 146/103  03/29/22 (!) 140/98   Wt Readings from Last 3 Encounters:  05/12/23 (!) 311 lb (141.1 kg)  04/28/22 300 lb (136.1 kg)  03/29/22 (!) 303 lb (137.4 kg)       Physical Exam Vitals and nursing note reviewed.  Constitutional:      General: He is awake. He is not in acute distress.    Appearance: Normal appearance. He is well-developed and well-groomed. He is obese. He is not ill-appearing, toxic-appearing or diaphoretic.  HENT:  Head: Normocephalic and atraumatic.     Jaw: There is normal jaw occlusion. No trismus, tenderness, swelling or pain on movement.     Salivary Glands: Right salivary gland is not diffusely enlarged or tender. Left salivary gland is not diffusely enlarged or tender.     Right Ear: Hearing, tympanic membrane, ear canal and external ear normal. There is no impacted cerumen.     Left Ear: Hearing, tympanic membrane, ear canal and external ear normal. There is no impacted cerumen.     Nose: Nose normal. No congestion or rhinorrhea.     Right Turbinates: Not enlarged, swollen or pale.     Left Turbinates: Not enlarged, swollen or pale.     Right Sinus: No maxillary sinus tenderness or frontal sinus tenderness.     Left Sinus: No maxillary sinus tenderness or frontal sinus tenderness.     Mouth/Throat:     Lips: Pink.     Mouth: Mucous membranes are moist. No injury, lacerations, oral lesions or angioedema.     Pharynx: Oropharynx is clear. Uvula midline. No pharyngeal swelling, oropharyngeal exudate or posterior oropharyngeal erythema.     Tonsils: No tonsillar exudate or tonsillar abscesses.  Eyes:     General: Lids are normal. Vision grossly intact. Gaze aligned appropriately.        Right eye: No discharge.        Left eye: No discharge.     Extraocular Movements: Extraocular movements intact.     Conjunctiva/sclera: Conjunctivae normal.     Pupils: Pupils are equal, round, and reactive to light.  Neck:     Thyroid: No thyroid mass, thyromegaly or thyroid tenderness.     Vascular: No carotid bruit.     Trachea: Trachea normal. No tracheal tenderness.  Cardiovascular:     Rate and Rhythm: Normal rate and regular  rhythm.     Pulses: Normal pulses.          Carotid pulses are 2+ on the right side and 2+ on the left side.      Radial pulses are 2+ on the right side and 2+ on the left side.       Femoral pulses are 2+ on the right side and 2+ on the left side.      Popliteal pulses are 2+ on the right side and 2+ on the left side.       Dorsalis pedis pulses are 2+ on the right side and 2+ on the left side.       Posterior tibial pulses are 2+ on the right side and 2+ on the left side.     Heart sounds: Normal heart sounds, S1 normal and S2 normal. No murmur heard.    No friction rub. No gallop.  Pulmonary:     Effort: Pulmonary effort is normal. No respiratory distress.     Breath sounds: Normal breath sounds and air entry. No stridor. No wheezing, rhonchi or rales.  Chest:     Chest wall: No tenderness.  Abdominal:     General: Abdomen is flat. Bowel sounds are normal. There is no distension.     Palpations: Abdomen is soft. There is no mass.     Tenderness: There is no abdominal tenderness. There is no guarding or rebound.     Hernia: No hernia is present.  Genitourinary:    Comments: Exam deferred; denies complaints Musculoskeletal:        General: No swelling, tenderness, deformity or signs of injury. Normal range  of motion.     Cervical back: Normal range of motion and neck supple. No rigidity or tenderness.     Right lower leg: No edema.     Left lower leg: No edema.  Lymphadenopathy:     Cervical: No cervical adenopathy.     Right cervical: No superficial, deep or posterior cervical adenopathy.    Left cervical: No superficial, deep or posterior cervical adenopathy.  Skin:    General: Skin is warm and dry.     Capillary Refill: Capillary refill takes less than 2 seconds.     Coloration: Skin is not jaundiced or pale.     Findings: No bruising, erythema, lesion or rash.  Neurological:     General: No focal deficit present.     Mental Status: He is alert and oriented to person, place,  and time. Mental status is at baseline.     GCS: GCS eye subscore is 4. GCS verbal subscore is 5. GCS motor subscore is 6.     Sensory: Sensation is intact. No sensory deficit.     Motor: Motor function is intact. No weakness.     Coordination: Coordination is intact.     Gait: Gait is intact.  Psychiatric:        Attention and Perception: Attention and perception normal.        Mood and Affect: Mood and affect normal.        Speech: Speech normal.        Behavior: Behavior normal. Behavior is cooperative.        Thought Content: Thought content normal.        Cognition and Memory: Cognition normal.        Judgment: Judgment normal.     Last depression screening scores    05/12/2023    8:48 AM 04/28/2022    8:06 AM 01/06/2022    9:51 AM  PHQ 2/9 Scores  PHQ - 2 Score 0 0 0  PHQ- 9 Score 0 2 2   Last fall risk screening    05/12/2023    8:48 AM  Fall Risk   Falls in the past year? 0  Number falls in past yr: 0  Injury with Fall? 0   Last Audit-C alcohol use screening    05/11/2023    9:59 AM  Alcohol Use Disorder Test (AUDIT)  1. How often do you have a drink containing alcohol? 3  2. How many drinks containing alcohol do you have on a typical day when you are drinking? 0  3. How often do you have six or more drinks on one occasion? 1  AUDIT-C Score 4  4. How often during the last year have you found that you were not able to stop drinking once you had started? 0  5. How often during the last year have you failed to do what was normally expected from you because of drinking? 0  6. How often during the last year have you needed a first drink in the morning to get yourself going after a heavy drinking session? 0  7. How often during the last year have you had a feeling of guilt of remorse after drinking? 0  8. How often during the last year have you been unable to remember what happened the night before because you had been drinking? 0  9. Have you or someone else been  injured as a result of your drinking? 0  10. Has a relative or friend or a doctor  or another health worker been concerned about your drinking or suggested you cut down? 0  Alcohol Use Disorder Identification Test Final Score (AUDIT) 4   A score of 3 or more in women, and 4 or more in men indicates increased risk for alcohol abuse, EXCEPT if all of the points are from question 1   No results found for any visits on 05/12/23.  Assessment & Plan    Routine Health Maintenance and Physical Exam  Exercise Activities and Dietary recommendations  Goals   None     Immunization History  Administered Date(s) Administered   Hepatitis B 04/28/1998, 05/26/1998, 10/27/1998   Influenza,inj,Quad PF,6+ Mos 05/21/2015, 04/22/2018, 07/16/2019   MMR 11/27/2004   PFIZER(Purple Top)SARS-COV-2 Vaccination 09/17/2019, 10/10/2019, 06/18/2020   Tdap 02/06/2018    Health Maintenance  Topic Date Due   COVID-19 Vaccine (4 - 2023-24 season) 03/13/2023   INFLUENZA VACCINE  10/10/2023 (Originally 02/10/2023)   DTaP/Tdap/Td (2 - Td or Tdap) 02/07/2028   Hepatitis C Screening  Completed   HIV Screening  Completed   HPV VACCINES  Aged Out    Discussed health benefits of physical activity, and encouraged him to engage in regular exercise appropriate for his age and condition.  STOP Bang + snores loud +feels fatigued +witnessed apnea +BMI >35 +male +large neck +htn -<50 yo     05/12/2023    9:00 AM  Results of the Epworth flowsheet  Sitting and reading 3  Watching TV 2  Sitting, inactive in a public place (e.g. a theatre or a meeting) 0  As a passenger in a car for an hour without a break 2  Lying down to rest in the afternoon when circumstances permit 2  Sitting and talking to someone 0  Sitting quietly after a lunch without alcohol 1  In a car, while stopped for a few minutes in traffic 0  Total score 10      I, Jacky Kindle, FNP, have reviewed all documentation for this visit. The  documentation on 05/12/23 for the exam, diagnosis, procedures, and orders are all accurate and complete.    Jacky Kindle, FNP  [facility] [phone] (phone) [phone] (fax)  [facility]

## 2023-05-12 NOTE — Assessment & Plan Note (Signed)
Repeat A1c given 12 lbs weight gain Continue to recommend balanced, lower carb meals. Smaller meal size, adding snacks. Choosing water as drink of choice and increasing purposeful exercise.,

## 2023-05-12 NOTE — Assessment & Plan Note (Signed)
Chronic, worsening Body mass index is 39.93 kg/m. Off intermittent fasting at this time Continues to work on ETOH reduction Encourage exercise goal of 150 mins/week

## 2023-05-12 NOTE — Assessment & Plan Note (Signed)
Acute on chronic, recurrent Worsening with life's stressors Seeing a counselor but wants to restart medication I've explained to him that drugs of the SSRI class can have side effects such as weight gain, sexual dysfunction, insomnia, headache, nausea. These medications are generally effective at alleviating symptoms of anxiety and/or depression. Let me know if significant side effects do occur.

## 2023-05-13 ENCOUNTER — Encounter: Payer: Self-pay | Admitting: Family Medicine

## 2023-05-13 ENCOUNTER — Other Ambulatory Visit: Payer: Self-pay | Admitting: Family Medicine

## 2023-05-13 LAB — CBC WITH DIFFERENTIAL/PLATELET
Basophils Absolute: 0 10*3/uL (ref 0.0–0.2)
Basos: 1 %
EOS (ABSOLUTE): 0.1 10*3/uL (ref 0.0–0.4)
Eos: 1 %
Hematocrit: 45.2 % (ref 37.5–51.0)
Hemoglobin: 15.6 g/dL (ref 13.0–17.7)
Immature Grans (Abs): 0 10*3/uL (ref 0.0–0.1)
Immature Granulocytes: 0 %
Lymphocytes Absolute: 2.4 10*3/uL (ref 0.7–3.1)
Lymphs: 35 %
MCH: 30.8 pg (ref 26.6–33.0)
MCHC: 34.5 g/dL (ref 31.5–35.7)
MCV: 89 fL (ref 79–97)
Monocytes Absolute: 0.6 10*3/uL (ref 0.1–0.9)
Monocytes: 9 %
Neutrophils Absolute: 3.8 10*3/uL (ref 1.4–7.0)
Neutrophils: 54 %
Platelets: 303 10*3/uL (ref 150–450)
RBC: 5.07 x10E6/uL (ref 4.14–5.80)
RDW: 12.7 % (ref 11.6–15.4)
WBC: 7 10*3/uL (ref 3.4–10.8)

## 2023-05-13 LAB — COMPREHENSIVE METABOLIC PANEL
ALT: 35 [IU]/L (ref 0–44)
AST: 30 [IU]/L (ref 0–40)
Albumin: 4.6 g/dL (ref 4.1–5.1)
Alkaline Phosphatase: 107 [IU]/L (ref 44–121)
BUN/Creatinine Ratio: 13 (ref 9–20)
BUN: 13 mg/dL (ref 6–20)
Bilirubin Total: 0.4 mg/dL (ref 0.0–1.2)
CO2: 22 mmol/L (ref 20–29)
Calcium: 9.5 mg/dL (ref 8.7–10.2)
Chloride: 98 mmol/L (ref 96–106)
Creatinine, Ser: 0.99 mg/dL (ref 0.76–1.27)
Globulin, Total: 2.5 g/dL (ref 1.5–4.5)
Glucose: 133 mg/dL — ABNORMAL HIGH (ref 70–99)
Potassium: 3.7 mmol/L (ref 3.5–5.2)
Sodium: 138 mmol/L (ref 134–144)
Total Protein: 7.1 g/dL (ref 6.0–8.5)
eGFR: 101 mL/min/{1.73_m2} (ref >59)

## 2023-05-13 LAB — LIPID PANEL
Chol/HDL Ratio: 12.9 ratio — ABNORMAL HIGH (ref 0.0–5.0)
Cholesterol, Total: 335 mg/dL — ABNORMAL HIGH (ref 100–199)
HDL: 26 mg/dL — ABNORMAL LOW (ref >39)
Triglycerides: 1266 mg/dL (ref 0–149)

## 2023-05-13 LAB — TSH: TSH: 1.76 u[IU]/mL (ref 0.450–4.500)

## 2023-05-13 LAB — HEMOGLOBIN A1C
Est. average glucose Bld gHb Est-mCnc: 126 mg/dL
Hgb A1c MFr Bld: 6 % — ABNORMAL HIGH (ref 4.8–5.6)

## 2023-05-13 MED ORDER — EZETIMIBE 10 MG PO TABS: 10.0000 mg | ORAL_TABLET | Freq: Every day | ORAL | 3 refills | Status: DC

## 2023-05-13 MED ORDER — FENOFIBRATE 145 MG PO TABS
145.0000 mg | ORAL_TABLET | Freq: Every day | ORAL | 3 refills | Status: DC
Start: 2023-05-13 — End: 2024-04-23

## 2023-05-13 MED ORDER — METFORMIN HCL ER 500 MG PO TB24: 500.0000 mg | ORAL_TABLET | Freq: Every day | ORAL | 3 refills | Status: DC

## 2023-05-13 MED ORDER — ROSUVASTATIN CALCIUM 40 MG PO TABS: 40.0000 mg | ORAL_TABLET | Freq: Every day | ORAL | 3 refills | Status: DC

## 2023-05-13 NOTE — Progress Notes (Signed)
Very high triglycerides; unable to provide LDL value given chronic cholesterol Call pharmacy; stop pravastatin. Add crestor 40, zetia 10, fenofibrate high dose. Pre-diabetes worsened; consider metforming 500 xr daily

## 2023-06-02 ENCOUNTER — Other Ambulatory Visit: Payer: Self-pay | Admitting: Family Medicine

## 2023-06-02 NOTE — Telephone Encounter (Signed)
Requested Prescriptions  Pending Prescriptions Disp Refills   amLODipine (NORVASC) 10 MG tablet [Pharmacy Med Name: AMLODIPINE BESYLATE TABS 10MG ] 90 tablet 3    Sig: TAKE 1 TABLET DAILY     Cardiovascular: Calcium Channel Blockers 2 Passed - 06/02/2023 12:15 AM      Passed - Last BP in normal range    BP Readings from Last 1 Encounters:  05/12/23 133/88         Passed - Last Heart Rate in normal range    Pulse Readings from Last 1 Encounters:  05/12/23 80         Passed - Valid encounter within last 6 months    Recent Outpatient Visits           3 weeks ago Annual physical exam   [facility] Jacky Kindle, FNP   1 year ago Essential hypertension   [facility] Jacky Kindle, FNP   1 year ago Essential hypertension   [facility] Jacky Kindle, FNP   1 year ago Annual physical exam   [facility] Jacky Kindle, FNP   2 years ago Annual physical exam   [facility] Chrismon, Jodell Cipro, PA-C       Future Appointments             In 3 weeks Jacky Kindle, FNP [facility], [facility]

## 2023-06-17 ENCOUNTER — Encounter: Payer: Self-pay | Admitting: Family Medicine

## 2023-06-24 ENCOUNTER — Encounter: Payer: Self-pay | Admitting: Family Medicine

## 2023-06-24 ENCOUNTER — Other Ambulatory Visit: Payer: Self-pay | Admitting: Family Medicine

## 2023-06-24 ENCOUNTER — Ambulatory Visit (INDEPENDENT_AMBULATORY_CARE_PROVIDER_SITE_OTHER): Payer: BC Managed Care – PPO | Admitting: Family Medicine

## 2023-06-24 VITALS — BP 129/89 | HR 75 | Ht 74.0 in | Wt 318.4 lb

## 2023-06-24 DIAGNOSIS — F39 Unspecified mood [affective] disorder: Secondary | ICD-10-CM

## 2023-06-24 DIAGNOSIS — G4733 Obstructive sleep apnea (adult) (pediatric): Secondary | ICD-10-CM

## 2023-06-24 DIAGNOSIS — I10 Essential (primary) hypertension: Secondary | ICD-10-CM

## 2023-06-24 MED ORDER — HYDRALAZINE HCL 50 MG PO TABS: 50.0000 mg | ORAL_TABLET | Freq: Three times a day (TID) | ORAL | 0 refills | Status: DC

## 2023-06-24 MED ORDER — CITALOPRAM HYDROBROMIDE 20 MG PO TABS: 20.0000 mg | ORAL_TABLET | Freq: Every day | ORAL | 0 refills | Status: DC

## 2023-06-24 NOTE — Progress Notes (Unsigned)
Established patient visit   Patient: James Cooley   DOB: [birth year]   37 y.o. Male  MRN: [id number] Visit Date: 06/24/2023  Today's healthcare provider: Jacky Kindle, FNP  Introduced to nurse practitioner role and practice setting.  All questions answered.  Discussed provider/patient relationship and expectations.  Chief Complaint  Patient presents with   Medical Management of Chronic Issues    6 week follow-up. Patient reports taking medications as prescribed and every blue moon he will notice a racing heart beat. Typically at night when he gets up to go to the restroom and then comes back to lay down.    Diabetes   Subjective    Diabetes   HPI     Medical Management of Chronic Issues    Additional comments: 6 week follow-up. Patient reports taking medications as prescribed and every blue moon he will notice a racing heart beat. Typically at night when he gets up to go to the restroom and then comes back to lay down.         Diabetes   Blurred vision: Absent.  Chest pain: Absent.  Fatigue: Absent.  Foot Ulcerations: Absent.  Nausea: Absent.  Paresthesia of the feet: Absent.  Polydipsia: Absent.  Polyuria: Absent.  Visual changes: Absent.  Vomiting: Absent.  Weight loss: Absent.  Episodes of hypoglycemia: Absent.  Eye exam is not current.  Patient does not see a podiatrist.  The patient exercises three times a week.      Last edited by Acey Lav, CMA on 06/24/2023  3:48 PM.      Seen 6 weeks ago for CPE; started on hydral at 25 mg TID and celexa added at 20 mg daily.     06/24/2023    3:55 PM 05/12/2023    8:48 AM 04/28/2022    8:06 AM  PHQ9 SCORE ONLY  PHQ-9 Total Score 2 0 2      06/24/2023    3:55 PM 05/12/2023    8:48 AM  GAD 7 : Generalized Anxiety Score  Nervous, Anxious, on Edge 1 2  Control/stop worrying 0 1  Worry too much - different things 0 1  Trouble relaxing 0 1  Restless  0  Easily annoyed or irritable 1 1  Afraid - awful  might happen 0 0  Total GAD 7 Score  6  Anxiety Difficulty Not difficult at all Not difficult at all     Medications: Outpatient Medications Prior to Visit  Medication Sig   amLODipine (NORVASC) 10 MG tablet TAKE 1 TABLET DAILY   ezetimibe (ZETIA) 10 MG tablet Take 1 tablet (10 mg total) by mouth daily.   fenofibrate (TRICOR) 145 MG tablet Take 1 tablet (145 mg total) by mouth daily.   losartan-hydrochlorothiazide (HYZAAR) 100-25 MG tablet TAKE 1 TABLET DAILY (PLEASE SCHEDULE OFFICE VISIT BEFORE ANY FUTURE REFILL)   metFORMIN (GLUCOPHAGE-XR) 500 MG 24 hr tablet Take 1 tablet (500 mg total) by mouth daily with breakfast.   omega-3 acid ethyl esters (LOVAZA) 1 g capsule Take 2 capsules (2 g total) by mouth 2 (two) times daily.   rosuvastatin (CRESTOR) 40 MG tablet Take 1 tablet (40 mg total) by mouth daily.   [DISCONTINUED] citalopram (CELEXA) 20 MG tablet Take 1 tablet (20 mg total) by mouth daily.   [DISCONTINUED] hydrALAZINE (APRESOLINE) 25 MG tablet Take 1 tablet (25 mg total) by mouth 3 (three) times daily. Goal of TID dosing ex 0600/1400/2200 If unable- trial 0900/2100 or similar  No facility-administered medications prior to visit.   Last CBC Lab Results  Component Value Date   WBC 7.0 05/12/2023   HGB 15.6 05/12/2023   HCT 45.2 05/12/2023   MCV 89 05/12/2023   MCH 30.8 05/12/2023   RDW 12.7 05/12/2023   PLT 303 05/12/2023   Last metabolic panel Lab Results  Component Value Date   GLUCOSE 133 (H) 05/12/2023   NA 138 05/12/2023   K 3.7 05/12/2023   CL 98 05/12/2023   CO2 22 05/12/2023   BUN 13 05/12/2023   CREATININE 0.99 05/12/2023   EGFR 101 05/12/2023   CALCIUM 9.5 05/12/2023   PROT 7.1 05/12/2023   ALBUMIN 4.6 05/12/2023   LABGLOB 2.5 05/12/2023   AGRATIO 1.6 01/06/2022   BILITOT 0.4 05/12/2023   ALKPHOS 107 05/12/2023   AST 30 05/12/2023   ALT 35 05/12/2023   Last lipids Lab Results  Component Value Date   CHOL 335 (H) 05/12/2023   HDL 26 (L)  05/12/2023   LDLCALC Comment (A) 05/12/2023   TRIG 1,266 (HH) 05/12/2023   CHOLHDL 12.9 (H) 05/12/2023   Last hemoglobin A1c Lab Results  Component Value Date   HGBA1C 6.0 (H) 05/12/2023   Last thyroid functions Lab Results  Component Value Date   TSH 1.760 05/12/2023   Last vitamin D No results found for: "25OHVITD2", "25OHVITD3", "VD25OH" Last vitamin B12 and Folate No results found for: "VITAMINB12", "FOLATE"    Objective    BP 129/89 Comment: 11:02 am // patient provided image  Pulse 75   Ht 6\' 2"  (1.88 m)   Wt (!) 318 lb 6.4 oz (144.4 kg)   SpO2 100%   BMI 40.88 kg/m   BP Readings from Last 3 Encounters:  06/24/23 129/89  05/12/23 133/88  04/28/22 (!) 146/103   Wt Readings from Last 3 Encounters:  06/24/23 (!) 318 lb 6.4 oz (144.4 kg)  05/12/23 (!) 311 lb (141.1 kg)  04/28/22 300 lb (136.1 kg)   SpO2 Readings from Last 3 Encounters:  06/24/23 100%  05/12/23 99%  04/28/22 100%   Physical Exam Vitals and nursing note reviewed.  Constitutional:      Appearance: Normal appearance. He is obese.  HENT:     Head: Normocephalic and atraumatic.  Cardiovascular:     Rate and Rhythm: Normal rate and regular rhythm.     Pulses: Normal pulses.     Heart sounds: Normal heart sounds.  Pulmonary:     Effort: Pulmonary effort is normal.     Breath sounds: Normal breath sounds.  Musculoskeletal:        General: Normal range of motion.     Cervical back: Normal range of motion.  Skin:    General: Skin is warm and dry.     Capillary Refill: Capillary refill takes less than 2 seconds.  Neurological:     General: No focal deficit present.     Mental Status: He is alert and oriented to person, place, and time. Mental status is at baseline.  Psychiatric:        Mood and Affect: Mood normal.        Behavior: Behavior normal.        Thought Content: Thought content normal.        Judgment: Judgment normal.     No results found for any visits on 06/24/23.   Assessment & Plan     Problem List Items Addressed This Visit       Cardiovascular and Mediastinum  Essential hypertension   Chronic, remains borderline elevated Recommend further titration of antihypertensive with hopes that once CPAP and consistent diet/exercise is utilized we can scale back Increase hydral from 25 mg TID to 50 mg TID Continue hyzaar 100-25 with norvasc 10      Relevant Medications   hydrALAZINE (APRESOLINE) 50 MG tablet     Respiratory   OSA (obstructive sleep apnea)   Chronic, reviewed moderate AHI on testing DME request placed        Other   Mood disorder (HCC) - Primary   Acute on chronic, recurrent Worsening with life's stressors Seeing a counselor but wants to restart medication I've explained to him that drugs of the SSRI class can have side effects such as weight gain, sexual dysfunction, insomnia, headache, nausea. These medications are generally effective at alleviating symptoms of anxiety and/or depression. Let me know if significant side effects do occur.  Chronic, improved- wishes to continue Celexa 20 Pt notes a positive change as well as wife    06/24/2023    3:55 PM 05/12/2023    8:48 AM 04/28/2022    8:06 AM  PHQ9 SCORE ONLY  PHQ-9 Total Score 2 0 2      06/24/2023    3:55 PM 05/12/2023    8:48 AM  GAD 7 : Generalized Anxiety Score  Nervous, Anxious, on Edge 1 2  Control/stop worrying 0 1  Worry too much - different things 0 1  Trouble relaxing 0 1  Restless  0  Easily annoyed or irritable 1 1  Afraid - awful might happen 0 0  Total GAD 7 Score  6  Anxiety Difficulty Not difficult at all Not difficult at all    Denies SI or HI      Relevant Medications   citalopram (CELEXA) 20 MG tablet   Morbid obesity (HCC)   Chronic, slight weight gain Body mass index is 40.88 kg/m. Discussed importance of healthy weight management Discussed diet and exercise       No follow-ups on file.     Leilani Merl, FNP, have  reviewed all documentation for this visit. The documentation on 06/26/23 for the exam, diagnosis, procedures, and orders are all accurate and complete.  Jacky Kindle, FNP  [facility] [phone] (phone) [phone] (fax)  [facility]

## 2023-06-26 ENCOUNTER — Encounter: Payer: Self-pay | Admitting: Family Medicine

## 2023-06-26 NOTE — Assessment & Plan Note (Signed)
Chronic, reviewed moderate AHI on testing DME request placed

## 2023-06-26 NOTE — Assessment & Plan Note (Signed)
Acute on chronic, recurrent Worsening with life's stressors Seeing a counselor but wants to restart medication I've explained to him that drugs of the SSRI class can have side effects such as weight gain, sexual dysfunction, insomnia, headache, nausea. These medications are generally effective at alleviating symptoms of anxiety and/or depression. Let me know if significant side effects do occur.  Chronic, improved- wishes to continue Celexa 20 Pt notes a positive change as well as wife    06/24/2023    3:55 PM 05/12/2023    8:48 AM 04/28/2022    8:06 AM  PHQ9 SCORE ONLY  PHQ-9 Total Score 2 0 2      06/24/2023    3:55 PM 05/12/2023    8:48 AM  GAD 7 : Generalized Anxiety Score  Nervous, Anxious, on Edge 1 2  Control/stop worrying 0 1  Worry too much - different things 0 1  Trouble relaxing 0 1  Restless  0  Easily annoyed or irritable 1 1  Afraid - awful might happen 0 0  Total GAD 7 Score  6  Anxiety Difficulty Not difficult at all Not difficult at all    Denies SI or HI

## 2023-06-26 NOTE — Assessment & Plan Note (Signed)
Chronic, remains borderline elevated Recommend further titration of antihypertensive with hopes that once CPAP and consistent diet/exercise is utilized we can scale back Increase hydral from 25 mg TID to 50 mg TID Continue hyzaar 100-25 with norvasc 10

## 2023-06-26 NOTE — Assessment & Plan Note (Signed)
Chronic, slight weight gain Body mass index is 40.88 kg/m. Discussed importance of healthy weight management Discussed diet and exercise

## 2023-07-04 ENCOUNTER — Telehealth: Payer: Self-pay

## 2023-07-04 ENCOUNTER — Encounter: Payer: Self-pay | Admitting: Family Medicine

## 2023-07-04 DIAGNOSIS — R0683 Snoring: Secondary | ICD-10-CM

## 2023-07-04 DIAGNOSIS — R0681 Apnea, not elsewhere classified: Secondary | ICD-10-CM

## 2023-07-04 NOTE — Telephone Encounter (Signed)
Placed in fax pile to be sent. Sleep study order originally sent in error

## 2023-07-04 NOTE — Telephone Encounter (Signed)
Copied from CRM [id number]. Topic: General - Other >> Jul 04, 2023 10:10 AM Turkey B wrote: Reason for CRM: pt called in checking on status of cpap machine being ordered [facility]

## 2023-07-11 ENCOUNTER — Other Ambulatory Visit: Payer: Self-pay | Admitting: Family Medicine

## 2023-07-11 DIAGNOSIS — I10 Essential (primary) hypertension: Secondary | ICD-10-CM

## 2023-07-14 NOTE — Telephone Encounter (Signed)
 CPap order on patient chart under chart review > other order > originally placed 06/24/23 (in case fax fails) Will place on sorter  Fax # [phone]

## 2023-07-15 NOTE — Telephone Encounter (Signed)
 Paperwork faxed off today

## 2023-07-15 NOTE — Telephone Encounter (Signed)
 New, Adine Champ, Severo, CMA; Joylene Carlean Nail, Corrina Joshua [place] Received, thank you!       Previous Messages    ----- Message ----- From: Champ Severo, CMA Sent: 07/14/2023   4:06 PM EST To: Eleanor Almer Adine Joylene; [place] Joshua  Order placed for CPAP. Patient would like order to be sent to [facility]    For records purpose only. DME team confirmed order received to assist with process.

## 2023-07-25 ENCOUNTER — Other Ambulatory Visit: Payer: Self-pay | Admitting: Family Medicine

## 2023-08-05 ENCOUNTER — Ambulatory Visit: Payer: BC Managed Care – PPO | Admitting: Family Medicine

## 2023-08-05 ENCOUNTER — Encounter: Payer: Self-pay | Admitting: Family Medicine

## 2023-08-05 VITALS — BP 134/77 | HR 73 | Ht 74.0 in | Wt 311.0 lb

## 2023-08-05 DIAGNOSIS — E7800 Pure hypercholesterolemia, unspecified: Secondary | ICD-10-CM

## 2023-08-05 DIAGNOSIS — F39 Unspecified mood [affective] disorder: Secondary | ICD-10-CM | POA: Diagnosis not present

## 2023-08-05 DIAGNOSIS — I10 Essential (primary) hypertension: Secondary | ICD-10-CM

## 2023-08-05 DIAGNOSIS — R7303 Prediabetes: Secondary | ICD-10-CM | POA: Insufficient documentation

## 2023-08-05 DIAGNOSIS — G4733 Obstructive sleep apnea (adult) (pediatric): Secondary | ICD-10-CM

## 2023-08-05 MED ORDER — CITALOPRAM HYDROBROMIDE 20 MG PO TABS: 20.0000 mg | ORAL_TABLET | Freq: Every day | ORAL | 1 refills | Status: DC

## 2023-08-05 NOTE — Assessment & Plan Note (Addendum)
Chronic, stable today Improved control with current regimen of Norvasc 10mg , Hyzaar 100/25mg , and Hydralazine 50mg . Home readings in the 120s-130s/80s.  GOAL SBP<130; DBP<80 Continue at home monitoring with upper arm cuff Mild pedal edema noted, but not bothersome to the patient. -Continue current antihypertensive regimen. -Consider referral to cardiology if blood pressure control remains suboptimal. - CMP check today - Continue low sodium diet, exercise, and weight loss F/u in 6 months

## 2023-08-05 NOTE — Progress Notes (Signed)
Established Patient Office Visit  Introduced to nurse practitioner role and practice setting.  All questions answered.  Discussed provider/patient relationship and expectations.   Subjective   Patient ID: James Cooley, male    DOB: 1986/03/18  Age: 38 y.o. MRN: [id number]  Chief Complaint  Patient presents with   Follow-up    HTN f/u, with labs for HLD and pre-diabetes    A 38 year old patient with a history of hypertension, sleep apnea, and pre-diabetes presents for a routine follow-up and lab work.  CPAP - paperwork work sent, pt should receive in coming weeks  HTN - previous readings as high as 160/100.Current regimen of Norvasc 10mg , Hyzaar 100/25, and Hydralazine 50mg  for blood pressure control. Recent home readings have shown improvement, with systolic readings in the 120s to low 130s and diastolic readings around mid 80s. The patient has noticed a correlation between his blood pressure and swelling in his fingers, which has lessened with improved blood pressure control.  Pre-diabetes -  Metformin 500mg  BID, following an A1C reading of 6.0. The patient has been monitoring his blood sugar at home, with readings ranging from 85 to 110. The patient has also been attempting to manage his health through lifestyle changes, including intermittent fasting and increased physical activity.  HLD - Rosuvastatin, Ezetimibe, and Fenofibrate. The patient has not been taking Omega-3 supplements.   Anxiety - Celexa 20 mg daily, which he reports as effective.         08/05/2023    3:55 PM 06/24/2023    3:55 PM 05/12/2023    8:48 AM  Depression screen PHQ 2/9  Decreased Interest 0 0 0  Down, Depressed, Hopeless 0 0 0  PHQ - 2 Score 0 0 0  Altered sleeping 0 1 0  Tired, decreased energy 1 1 0  Change in appetite 0 0 0  Feeling bad or failure about yourself  0 0 0  Trouble concentrating 0 0 0  Moving slowly or fidgety/restless 0 0 0  Suicidal thoughts 0 0 0  PHQ-9 Score 1 2 0   Difficult doing work/chores Not difficult at all Not difficult at all Not difficult at all       08/05/2023    3:56 PM 06/24/2023    3:55 PM 05/12/2023    8:48 AM  GAD 7 : Generalized Anxiety Score  Nervous, Anxious, on James 0 1 2  Control/stop worrying 0 0 1  Worry too much - different things 0 0 1  Trouble relaxing 0 0 1  Restless 0  0  Easily annoyed or irritable 1 1 1   Afraid - awful might happen 0 0 0  Total GAD 7 Score 1  6  Anxiety Difficulty  Not difficult at all Not difficult at all     Review of Systems  All other systems reviewed and are negative.   Negative unless indicated in HPI   Objective:     BP 134/77 (BP Location: Left Arm, Patient Position: Sitting, Cuff Size: Large)   Pulse 73   Ht 6\' 2"  (1.88 m)   Wt (!) 311 lb (141.1 kg)   SpO2 97%   BMI 39.93 kg/m     Physical Exam Constitutional:      General: He is not in acute distress.    Appearance: Normal appearance. He is obese. He is not ill-appearing, toxic-appearing or diaphoretic.  HENT:     Head: Normocephalic.     Nose: Nose normal.     Mouth/Throat:  Mouth: Mucous membranes are moist.  Eyes:     Extraocular Movements: Extraocular movements intact.     Conjunctiva/sclera: Conjunctivae normal.     Pupils: Pupils are equal, round, and reactive to light.  Cardiovascular:     Rate and Rhythm: Normal rate and regular rhythm.     Heart sounds: No murmur heard.    No friction rub. No gallop.  Pulmonary:     Effort: Pulmonary effort is normal. No respiratory distress.     Breath sounds: Normal breath sounds. No stridor. No wheezing, rhonchi or rales.  Chest:     Chest wall: No tenderness.  Musculoskeletal:     Right lower leg: Edema present.     Left lower leg: Edema present.     Comments: Non pitting - pt states improving  Skin:    General: Skin is warm and dry.     Capillary Refill: Capillary refill takes less than 2 seconds.  Neurological:     General: No focal deficit present.      Mental Status: He is alert and oriented to person, place, and time. Mental status is at baseline.     Cranial Nerves: No cranial nerve deficit.     Sensory: No sensory deficit.     Motor: No weakness.     Coordination: Coordination normal.     Gait: Gait normal.  Psychiatric:        Mood and Affect: Mood normal.        Behavior: Behavior normal.        Thought Content: Thought content normal.        Judgment: Judgment normal.     No results found for any visits on 08/05/23.     The ASCVD Risk score (Arnett DK, et al., 2019) failed to calculate for the following reasons:   The 2019 ASCVD risk score is only valid for ages 88 to 8    Assessment & Plan:  Essential hypertension Assessment & Plan: Chronic, stable today Improved control with current regimen of Norvasc 10mg , Hyzaar 100/25mg , and Hydralazine 50mg . Home readings in the 120s-130s/80s.  GOAL SBP<130; DBP<80 Continue at home monitoring with upper arm cuff Mild pedal edema noted, but not bothersome to the patient. -Continue current antihypertensive regimen. -Consider referral to cardiology if blood pressure control remains suboptimal. - CMP check today - Continue low sodium diet, exercise, and weight loss F/u in 6 months  Orders: -     Comprehensive metabolic panel  Mood disorder (HCC) Assessment & Plan: Chronic, controlled Continue celexa 20mg  daily Refilled today  Orders: -     Citalopram Hydrobromide; Take 1 tablet (20 mg total) by mouth daily.  Dispense: 90 tablet; Refill: 1  Hypercholesteremia Assessment & Plan: Continue Crestor, Ezetimibe, Fenofibrate, Encourage to take daily omega 3  Pt fasting - will check lipid panel today   Orders: -     Lipid panel  Prediabetes Assessment & Plan: Previous A1C 6.0 On metformin 500 mg BID Will recheck A1c today Continue small frequent meals, increase protein, increase veggies and fruits, decrease starches Increase exercise and encourage weight  loss  Orders: -     Hemoglobin A1c  OSA (obstructive sleep apnea) Assessment & Plan: Sleep Study done CPAP order placed today, faxed over to [facility]     Return in about 6 months (around 02/02/2024).   I, Sallee Provencal, FNP, have reviewed all documentation for this visit. The documentation on 08/05/23 for the exam, diagnosis, procedures, and orders are  all accurate and complete.   Sallee Provencal, FNP

## 2023-08-05 NOTE — Assessment & Plan Note (Signed)
Sleep Study done CPAP order placed today, faxed over to [facility]

## 2023-08-05 NOTE — Assessment & Plan Note (Signed)
Chronic, controlled Continue celexa 20mg  daily Refilled today

## 2023-08-05 NOTE — Assessment & Plan Note (Signed)
Previous A1C 6.0 On metformin 500 mg BID Will recheck A1c today Continue small frequent meals, increase protein, increase veggies and fruits, decrease starches Increase exercise and encourage weight loss

## 2023-08-05 NOTE — Assessment & Plan Note (Signed)
Continue Crestor, Ezetimibe, Fenofibrate, Encourage to take daily omega 3  Pt fasting - will check lipid panel today

## 2023-08-06 LAB — COMPREHENSIVE METABOLIC PANEL
ALT: 33 [IU]/L (ref 0–44)
AST: 38 [IU]/L (ref 0–40)
Albumin: 5.1 g/dL (ref 4.1–5.1)
Alkaline Phosphatase: 63 [IU]/L (ref 44–121)
BUN/Creatinine Ratio: 11 (ref 9–20)
BUN: 13 mg/dL (ref 6–20)
Bilirubin Total: 0.7 mg/dL (ref 0.0–1.2)
CO2: 23 mmol/L (ref 20–29)
Calcium: 9.7 mg/dL (ref 8.7–10.2)
Chloride: 97 mmol/L (ref 96–106)
Creatinine, Ser: 1.2 mg/dL (ref 0.76–1.27)
Globulin, Total: 2.4 g/dL (ref 1.5–4.5)
Glucose: 99 mg/dL (ref 70–99)
Potassium: 3.5 mmol/L (ref 3.5–5.2)
Sodium: 137 mmol/L (ref 134–144)
Total Protein: 7.5 g/dL (ref 6.0–8.5)
eGFR: 80 mL/min/{1.73_m2} (ref >59)

## 2023-08-06 LAB — LIPID PANEL
Chol/HDL Ratio: 2.8 {ratio} (ref 0.0–5.0)
Cholesterol, Total: 95 mg/dL — ABNORMAL LOW (ref 100–199)
HDL: 34 mg/dL — ABNORMAL LOW (ref >39)
LDL Chol Calc (NIH): 17 mg/dL (ref 0–99)
Triglycerides: 309 mg/dL — ABNORMAL HIGH (ref 0–149)
VLDL Cholesterol Cal: 44 mg/dL — ABNORMAL HIGH (ref 5–40)

## 2023-08-06 LAB — HEMOGLOBIN A1C
Est. average glucose Bld gHb Est-mCnc: 111 mg/dL
Hgb A1c MFr Bld: 5.5 % (ref 4.8–5.6)

## 2023-08-08 ENCOUNTER — Encounter: Payer: Self-pay | Admitting: Family Medicine

## 2023-10-25 ENCOUNTER — Encounter: Payer: Self-pay | Admitting: Family Medicine

## 2023-10-25 DIAGNOSIS — I10 Essential (primary) hypertension: Secondary | ICD-10-CM

## 2023-10-25 MED ORDER — HYDRALAZINE HCL 50 MG PO TABS: 50.0000 mg | ORAL_TABLET | Freq: Three times a day (TID) | ORAL | 0 refills | Status: DC

## 2023-10-25 NOTE — Telephone Encounter (Signed)
 Received a fax from [facility] requesting refills for  hydrALAZINE (APRESOLINE) 50 MG tablet

## 2023-11-10 ENCOUNTER — Encounter: Payer: Self-pay | Admitting: Family Medicine

## 2023-11-23 ENCOUNTER — Ambulatory Visit: Admitting: Family Medicine

## 2023-11-23 ENCOUNTER — Encounter: Payer: Self-pay | Admitting: Family Medicine

## 2023-11-23 VITALS — BP 138/84 | HR 77 | Resp 16 | Ht 74.0 in | Wt 332.4 lb

## 2023-11-23 DIAGNOSIS — E781 Pure hyperglyceridemia: Secondary | ICD-10-CM | POA: Diagnosis not present

## 2023-11-23 DIAGNOSIS — M791 Myalgia, unspecified site: Secondary | ICD-10-CM

## 2023-11-23 DIAGNOSIS — R0609 Other forms of dyspnea: Secondary | ICD-10-CM | POA: Diagnosis not present

## 2023-11-23 DIAGNOSIS — R609 Edema, unspecified: Secondary | ICD-10-CM | POA: Diagnosis not present

## 2023-11-23 DIAGNOSIS — E7800 Pure hypercholesterolemia, unspecified: Secondary | ICD-10-CM

## 2023-11-23 DIAGNOSIS — I10 Essential (primary) hypertension: Secondary | ICD-10-CM

## 2023-11-23 DIAGNOSIS — G4733 Obstructive sleep apnea (adult) (pediatric): Secondary | ICD-10-CM

## 2023-11-23 NOTE — Progress Notes (Signed)
 Established patient visit   Patient: James Cooley   DOB: 25-Jun-1986   38 y.o. Male  MRN: [id number] Visit Date: 11/23/2023  Today's healthcare provider: Jeralene Mom, MD   Chief Complaint  Patient presents with   Edema    Ankle area and some muscle soreness.   Subjective    Discussed the use of AI scribe software for clinical note transcription with the patient, who gave verbal consent to proceed.  History of Present Illness   James Cooley "Madelyne Schiff" is a 38 year old male who presents with swollen ankles and muscle pain.  He has experienced swollen ankles for the past three weeks, with pitting edema noted by his wife. He mentions a history of some swelling, but it has recently become pitting and more noticeable over the past month.  He describes significant muscle pain, primarily in his thighs, severe enough to wake him at night. He has a history of back problems since high school, which have worsened over the past month. He sees a Land and had his back pain under control until recently. The muscle pain is mostly in his legs, with some contribution to his lower back pain.  He has been feeling more out of breath over the past week. He uses a CPAP machine for sleep apnea, which has improved his sleep quality over the last two months.  He notes a recent weight gain and attributes it partly to stress and changes in his diet, having stopped intermittent fasting. He feels more swollen overall, which he believes contributes to the weight gain.  He has been monitoring his blood sugar at home, which typically ranges in the mid to high 90s. He takes metformin , though he is unsure of the exact dosage. He mentions a medication adjustment in December or January, including an increase in amlodipine  from 5 mg to 10 mg.  He recalls experiencing rashes on his legs when walking extensively, such as during a trip to [place] and more recently at a [employer]. The rashes are red and  primarily affect one leg.     Lab Results  Component Value Date   CHOL 95 (L) 08/05/2023   HDL 34 (L) 08/05/2023   LDLCALC 17 08/05/2023   TRIG 309 (H) 08/05/2023   CHOLHDL 2.8 08/05/2023   Lab Results  Component Value Date   NA 137 08/05/2023   CL 97 08/05/2023   K 3.5 08/05/2023   CO2 23 08/05/2023   BUN 13 08/05/2023   CREATININE 1.20 08/05/2023   EGFR 80 08/05/2023   CALCIUM  9.7 08/05/2023   ALBUMIN 5.1 08/05/2023   GLUCOSE 99 08/05/2023      Medications: Outpatient Medications Prior to Visit  Medication Sig Note   amLODipine  (NORVASC ) 10 MG tablet TAKE 1 TABLET DAILY    citalopram  (CELEXA ) 20 MG tablet Take 1 tablet (20 mg total) by mouth daily.    ezetimibe  (ZETIA ) 10 MG tablet Take 1 tablet (10 mg total) by mouth daily.    fenofibrate  (TRICOR ) 145 MG tablet Take 1 tablet (145 mg total) by mouth daily.    hydrALAZINE  (APRESOLINE ) 50 MG tablet Take 1 tablet (50 mg total) by mouth 3 (three) times daily.    losartan -hydrochlorothiazide  (HYZAAR) 100-25 MG tablet TAKE 1 TABLET DAILY (PLEASE SCHEDULE OFFICE VISIT BEFORE ANY FUTURE REFILL)    metFORMIN  (GLUCOPHAGE -XR) 500 MG 24 hr tablet Take 1 tablet (500 mg total) by mouth daily with breakfast.    rosuvastatin  (CRESTOR ) 40 MG tablet Take  1 tablet (40 mg total) by mouth daily.    [DISCONTINUED] omega-3 acid ethyl esters (LOVAZA ) 1 g capsule Take 2 capsules (2 g total) by mouth 2 (two) times daily. 11/23/2023: patient stopped over a year ago   No facility-administered medications prior to visit.   Review of Systems     Objective    BP 138/84 (BP Location: Left Arm, Patient Position: Sitting, Cuff Size: Large) Comment: at home this morning  Pulse 77   Resp 16   Ht 6\' 2"  (1.88 m)   Wt (!) 332 lb 6.4 oz (150.8 kg)   SpO2 99%   BMI 42.68 kg/m   Physical Exam   General: Appearance:    Severely obese male in no acute distress  Eyes:    PERRL, conjunctiva/corneas clear, EOM's intact       Lungs:     Clear to  auscultation bilaterally, respirations unlabored  Heart:    Normal heart rate. Normal rhythm. No murmurs, rubs, or gallops.    MS:   All extremities are intact.  2+ pitting edema both ankles and lower legs.   Neurologic:   Awake, alert, oriented x 3. No apparent focal neurological defect.       Assessment & Plan     1. Edema, unspecified type (Primary) Multifactorial. Likely exacerbated by increase in hydralazine  in December on top of high dose amlodipine . Consider reducing one of these and increasing or adding spironolactone.   - TSH - Cortisol  2. Myalgia Likely secondary to combination of statin and fenofibrate .   - CK  Consider reducing statin and adding vascepa  as below.   3. Hypertriglyceridemia Previously on Lovaza  which he tolerated but was apparently not sufficiency effective. Consider adding Vascepa  and reducing statin due to myalgia. See how labs look first.   4. Dyspnea on exertion  - CBC - Comprehensive metabolic panel with GFR - Brain natriuretic peptide  5. Essential hypertension Near goal today, but meds likely primary factor of edema. Will likely adjust medications after reviewing labs.   6. Hypercholesteremia Very well controlled but combo of statin and fenofibrate  likely main factor in Myalgias as above.   7. OSA (obstructive sleep apnea) Continue CPAP       Jeralene Mom, MD  [facility] [phone] (phone) [phone] (fax)  [facility]

## 2023-11-24 LAB — CBC
Hematocrit: 39.6 % (ref 37.5–51.0)
Hemoglobin: 13.5 g/dL (ref 13.0–17.7)
MCH: 31 pg (ref 26.6–33.0)
MCHC: 34.1 g/dL (ref 31.5–35.7)
MCV: 91 fL (ref 79–97)
Platelets: 260 10*3/uL (ref 150–450)
RBC: 4.36 x10E6/uL (ref 4.14–5.80)
RDW: 12.3 % (ref 11.6–15.4)
WBC: 6.7 10*3/uL (ref 3.4–10.8)

## 2023-11-24 LAB — COMPREHENSIVE METABOLIC PANEL WITH GFR
ALT: 25 IU/L (ref 0–44)
AST: 28 IU/L (ref 0–40)
Albumin: 4.8 g/dL (ref 4.1–5.1)
Alkaline Phosphatase: 60 IU/L (ref 44–121)
BUN/Creatinine Ratio: 14 (ref 9–20)
BUN: 17 mg/dL (ref 6–20)
Bilirubin Total: 0.7 mg/dL (ref 0.0–1.2)
CO2: 19 mmol/L — ABNORMAL LOW (ref 20–29)
Calcium: 9.4 mg/dL (ref 8.7–10.2)
Chloride: 102 mmol/L (ref 96–106)
Creatinine, Ser: 1.18 mg/dL (ref 0.76–1.27)
Globulin, Total: 2.5 g/dL (ref 1.5–4.5)
Glucose: 124 mg/dL — ABNORMAL HIGH (ref 70–99)
Potassium: 3.9 mmol/L (ref 3.5–5.2)
Sodium: 136 mmol/L (ref 134–144)
Total Protein: 7.3 g/dL (ref 6.0–8.5)
eGFR: 82 mL/min/{1.73_m2} (ref >59)

## 2023-11-24 LAB — BRAIN NATRIURETIC PEPTIDE: BNP: 18.1 pg/mL (ref 0.0–100.0)

## 2023-11-24 LAB — CK: Total CK: 176 U/L (ref 49–439)

## 2023-11-24 LAB — TSH: TSH: 2.12 u[IU]/mL (ref 0.450–4.500)

## 2023-11-24 LAB — CORTISOL: Cortisol: 10.6 ug/dL (ref 6.2–19.4)

## 2023-11-25 NOTE — Progress Notes (Unsigned)
 [facility] [address] [place], [place] [address]  Pulmonary Sleep Medicine   Office Visit Note  Patient Name: James Cooley DOB: 1986/01/12 MRN [id number]    Chief Complaint: Obstructive Sleep Apnea visit  Brief History:  James Cooley is seen today for an initial consult for APAP@ 4-20 cmH2O. The patient has a 4 month history of sleep apnea. Patient is using PAP nightly.  The patient feels rested after sleeping with PAP.  The patient reports benefiting from PAP use. Reported sleepiness is  improved and the Epworth Sleepiness Score is 1 out of 24. Prior to PAP use, patient reports snoring, fatigue, gasping, headaches.The patient does not take naps. The patient complains of the following: none. The compliance download shows 100% compliance with an average use time of 7 hours 16 minutes. The AHI is 0.9.  The patient does not complain of limb movements disrupting sleep. The patient continues to require PAP therapy in order to eliminate sleep apnea.  ROS  General: (-) fever, (-) chills, (-) night sweat Nose and Sinuses: (-) nasal stuffiness or itchiness, (-) postnasal drip, (-) nosebleeds, (-) sinus trouble. Mouth and Throat: (-) sore throat, (-) hoarseness. Neck: (-) swollen glands, (-) enlarged thyroid, (-) neck pain. Respiratory: - cough, - shortness of breath, - wheezing. Neurologic: - numbness, - tingling. Psychiatric: + anxiety, - depression   Current Medication: Outpatient Encounter Medications as of 11/28/2023  Medication Sig   amLODipine  (NORVASC ) 10 MG tablet TAKE 1 TABLET DAILY   citalopram  (CELEXA ) 20 MG tablet Take 1 tablet (20 mg total) by mouth daily.   ezetimibe  (ZETIA ) 10 MG tablet Take 1 tablet (10 mg total) by mouth daily.   fenofibrate  (TRICOR ) 145 MG tablet Take 1 tablet (145 mg total) by mouth daily.   hydrALAZINE  (APRESOLINE ) 50 MG tablet Take 1 tablet (50 mg total) by mouth 3 (three) times daily.   losartan -hydrochlorothiazide  (HYZAAR) 100-25 MG  tablet TAKE 1 TABLET DAILY (PLEASE SCHEDULE OFFICE VISIT BEFORE ANY FUTURE REFILL)   metFORMIN  (GLUCOPHAGE -XR) 500 MG 24 hr tablet Take 1 tablet (500 mg total) by mouth daily with breakfast.   rosuvastatin  (CRESTOR ) 40 MG tablet Take 1 tablet (40 mg total) by mouth daily.   No facility-administered encounter medications on file as of 11/28/2023.    Surgical History: Past Surgical History:  Procedure Laterality Date   NO PAST SURGERIES      Medical History: Past Medical History:  Diagnosis Date   Asthma     Family History: Non contributory to the present illness  Social History: Social History   Socioeconomic History   Marital status: Married    Spouse name: Not on file   Number of children: Not on file   Years of education: Not on file   Highest education level: Doctorate  Occupational History   Not on file  Tobacco Use   Smoking status: Never   Smokeless tobacco: Never  Vaping Use   Vaping status: Never Used  Substance and Sexual Activity   Alcohol use: Yes    Comment: 1 per day   Drug use: No   Sexual activity: Yes  Other Topics Concern   Not on file  Social History Narrative   Not on file   Social Drivers of Health   Financial Resource Strain: Low Risk  (06/23/2023)   Overall Financial Resource Strain (CARDIA)    Difficulty of Paying Living Expenses: Not hard at all  Food Insecurity: No Food Insecurity (06/23/2023)   Hunger Vital Sign  Worried About Programme researcher, broadcasting/film/video in the Last Year: Never true    Ran Out of Food in the Last Year: Never true  Transportation Needs: No Transportation Needs (06/23/2023)   PRAPARE - Administrator, Civil Service (Medical): No    Lack of Transportation (Non-Medical): No  Physical Activity: Insufficiently Active (06/23/2023)   Exercise Vital Sign    Days of Exercise per Week: 2 days    Minutes of Exercise per Session: 20 min  Stress: No Stress Concern Present (06/23/2023)   [employer] of  Occupational Health - Occupational Stress Questionnaire    Feeling of Stress : Only a little  Recent Concern: Stress - Stress Concern Present (05/11/2023)   [employer] of Occupational Health - Occupational Stress Questionnaire    Feeling of Stress : To some extent  Social Connections: Moderately Integrated (06/23/2023)   Social Connection and Isolation Panel [NHANES]    Frequency of Communication with Friends and Family: Three times a week    Frequency of Social Gatherings with Friends and Family: Once a week    Attends Religious Services: 1 to 4 times per year    Active Member of [employer] or Organizations: No    Attends Engineer, structural: Not on file    Marital Status: Married  Catering manager Violence: Not on file    Vital Signs: Blood pressure (!) 146/88, pulse 85, resp. rate 16, height 6\' 2"  (1.88 m), weight (!) 332 lb (150.6 kg), SpO2 98%. Body mass index is 42.63 kg/m.    Examination: General Appearance: The patient is well-developed, well-nourished, and in no distress. Neck Circumference: 46 cm Skin: Gross inspection of skin unremarkable. Head: normocephalic, no gross deformities. Eyes: no gross deformities noted. ENT: ears appear grossly normal Neurologic: Alert and oriented. No involuntary movements.  STOP BANG RISK ASSESSMENT S (snore) Have you been told that you snore?     NO   T (tired) Are you often tired, fatigued, or sleepy during the day?   NO  O (obstruction) Do you stop breathing, choke, or gasp during sleep? NO   P (pressure) Do you have or are you being treated for high blood pressure? YES   B (BMI) Is your body index greater than 35 kg/m? YES   A (age) Are you 38 years old or older? NO   N (neck) Do you have a neck circumference greater than 16 inches?   YES   G (gender) Are you a male? YES   TOTAL STOP/BANG "YES" ANSWERS 4       A STOP-Bang score of 2 or less is considered low risk, and a score of 5 or more is high risk for  having either moderate or severe OSA. For people who score 3 or 4, doctors may need to perform further assessment to determine how likely they are to have OSA.         EPWORTH SLEEPINESS SCALE:  Scale:  (0)= no chance of dozing; (1)= slight chance of dozing; (2)= moderate chance of dozing; (3)= high chance of dozing  Chance  Situtation    Sitting and reading: 0    Watching TV: 0    Sitting Inactive in public: 0    As a passenger in car: 0      Lying down to rest: 1    Sitting and talking: 0    Sitting quielty after lunch: 0    In a car, stopped in traffic: 0   TOTAL SCORE:  1 out of 24    SLEEP STUDIES:  HST (07/2023) AHI 27.3/hr, min SpO2 81%   CPAP COMPLIANCE DATA:  Date Range: 10/11/2023-11/24/2023  Average Daily Use: 7 hours 16 minutes  Median Use: 7 hours 3 minutes  Compliance for > 4 Hours: 100%  AHI: 0.9 respiratory events per hour  Days Used: 45/45 days  Mask Leak: 11.8  95th Percentile Pressure: 13.1         LABS: Recent Results (from the past 2160 hours)  CBC     Status: None   Collection Time: 11/23/23  8:57 AM  Result Value Ref Range   WBC 6.7 3.4 - 10.8 x10E3/uL   RBC 4.36 4.14 - 5.80 x10E6/uL   Hemoglobin 13.5 13.0 - 17.7 g/dL   Hematocrit 91.4 78.2 - 51.0 %   MCV 91 79 - 97 fL   MCH 31.0 26.6 - 33.0 pg   MCHC 34.1 31.5 - 35.7 g/dL   RDW 95.6 21.3 - 08.6 %   Platelets 260 150 - 450 x10E3/uL  Comprehensive metabolic panel with GFR     Status: Abnormal   Collection Time: 11/23/23  8:57 AM  Result Value Ref Range   Glucose 124 (H) 70 - 99 mg/dL   BUN 17 6 - 20 mg/dL   Creatinine, Ser 5.78 0.76 - 1.27 mg/dL   eGFR 82 >46 NG/EXB/2.84   BUN/Creatinine Ratio 14 9 - 20   Sodium 136 134 - 144 mmol/L   Potassium 3.9 3.5 - 5.2 mmol/L   Chloride 102 96 - 106 mmol/L   CO2 19 (L) 20 - 29 mmol/L   Calcium  9.4 8.7 - 10.2 mg/dL   Total Protein 7.3 6.0 - 8.5 g/dL   Albumin 4.8 4.1 - 5.1 g/dL   Globulin, Total 2.5 1.5 - 4.5 g/dL    Bilirubin Total 0.7 0.0 - 1.2 mg/dL   Alkaline Phosphatase 60 44 - 121 IU/L   AST 28 0 - 40 IU/L   ALT 25 0 - 44 IU/L  Brain natriuretic peptide     Status: None   Collection Time: 11/23/23  8:57 AM  Result Value Ref Range   BNP 18.1 0.0 - 100.0 pg/mL    Comment: Siemens ADVIA Centaur XP methodology  TSH     Status: None   Collection Time: 11/23/23  8:57 AM  Result Value Ref Range   TSH 2.120 0.450 - 4.500 uIU/mL  Cortisol     Status: None   Collection Time: 11/23/23  8:57 AM  Result Value Ref Range   Cortisol 10.6 6.2 - 19.4 ug/dL    Comment: Please Note: The reference interval and flagging for  this test is for an AM collection. If this is a PM  collection please use:         Cortisol PM: 2.3-11.9   CK     Status: None   Collection Time: 11/23/23  8:57 AM  Result Value Ref Range   Total CK 176 49 - 439 U/L    Radiology: DG Chest 2 View Result Date: 12/10/2021 CLINICAL DATA:  Cough, shortness of breath EXAM: CHEST - 2 VIEW COMPARISON:  07/16/2020 FINDINGS: The heart size and mediastinal contours are within normal limits. New left lower lobe airspace opacity. Right lung is clear. No pleural effusion or pneumothorax. The visualized skeletal structures are unremarkable. IMPRESSION: Left lower lobe pneumonia. Electronically Signed   By: Leverne Reading D.O.   On: 12/10/2021 15:04    No results found.  No results  found.    Assessment and Plan: Patient Active Problem List   Diagnosis Date Noted   CPAP use counseling 11/28/2023   Prediabetes 08/05/2023   OSA (obstructive sleep apnea) 06/24/2023   Mood disorder (HCC) 05/12/2023   Metabolic syndrome 04/16/2022   Annual physical exam 01/06/2022   Essential hypertension 01/06/2022   Hypercholesteremia 01/06/2022   Hypertriglyceridemia 01/06/2022   Family history of diabetes mellitus (DM) 01/06/2022   Morbid obesity (HCC) 01/06/2022   Hypertension 05/21/2015   Kidney stones 05/21/2015   1. OSA (obstructive sleep apnea)  (Primary) The patient does tolerate PAP and reports  benefit from PAP use. The patient was reminded how to clean equipment and advised to replace supplies routinely. The patient was also counselled on weight loss. We discussed zepbound which apparently his insurance denied. The compliance is excellent. The AHI is 0.9.   OSA on cpap- controlled. Continue with excellent compliance with pap. CPAP continues to be medically necessary to treat this patient's OSA. F/u 33m   2. CPAP use counseling CPAP Counseling: had a lengthy discussion with the patient regarding the importance of PAP therapy in management of the sleep apnea. Patient appears to understand the risk factor reduction and also understands the risks associated with untreated sleep apnea. Patient will try to make a good faith effort to remain compliant with therapy. Also instructed the patient on proper cleaning of the device including the water must be changed daily if possible and use of distilled water is preferred. Patient understands that the machine should be regularly cleaned with appropriate recommended cleaning solutions that do not damage the PAP machine for example given white vinegar and water rinses. Other methods such as ozone treatment may not be as good as these simple methods to achieve cleaning.   3. Hypertension, unspecified type He monitors this closely. Continue with amlodipine , hydralazine , losartan /hydrochlorothiazide . F/u with provider as scheduled.      General Counseling: I have discussed the findings of the evaluation and examination with James Cooley.  I have also discussed any further diagnostic evaluation thatmay be needed or ordered today. James Cooley verbalizes understanding of the findings of todays visit. We also reviewed his medications today and discussed drug interactions and side effects including but not limited excessive drowsiness and altered mental states. We also discussed that there is always a risk not just to  him but also people around him. he has been encouraged to call the office with any questions or concerns that should arise related to todays visit.  No orders of the defined types were placed in this encounter.       I have personally obtained a history, examined the patient, evaluated laboratory and imaging results, formulated the assessment and plan and placed orders. This patient was seen today by James Rous, PA-C in collaboration with Dr. Cam Cava.   Cordie Deters, MD [facility] Diplomate ABMS Pulmonary Critical Care Medicine and Sleep Medicine

## 2023-11-28 ENCOUNTER — Ambulatory Visit (INDEPENDENT_AMBULATORY_CARE_PROVIDER_SITE_OTHER): Admitting: Internal Medicine

## 2023-11-28 VITALS — BP 146/88 | HR 85 | Resp 16 | Ht 74.0 in | Wt 332.0 lb

## 2023-11-28 DIAGNOSIS — G4733 Obstructive sleep apnea (adult) (pediatric): Secondary | ICD-10-CM

## 2023-11-28 DIAGNOSIS — Z7189 Other specified counseling: Secondary | ICD-10-CM | POA: Diagnosis not present

## 2023-11-28 DIAGNOSIS — I10 Essential (primary) hypertension: Secondary | ICD-10-CM | POA: Diagnosis not present

## 2023-11-28 NOTE — Patient Instructions (Signed)

## 2023-11-30 ENCOUNTER — Ambulatory Visit: Payer: Self-pay | Admitting: Family Medicine

## 2023-11-30 ENCOUNTER — Other Ambulatory Visit: Payer: Self-pay | Admitting: Family Medicine

## 2023-11-30 DIAGNOSIS — I10 Essential (primary) hypertension: Secondary | ICD-10-CM

## 2023-11-30 MED ORDER — VALSARTAN-HYDROCHLOROTHIAZIDE 160-25 MG PO TABS: 1.0000 | ORAL_TABLET | Freq: Every day | ORAL | 0 refills | Status: DC

## 2024-02-06 ENCOUNTER — Ambulatory Visit: Payer: Self-pay | Admitting: Family Medicine

## 2024-02-06 ENCOUNTER — Encounter: Payer: Self-pay | Admitting: Family Medicine

## 2024-02-06 VITALS — BP 134/82 | HR 85 | Resp 16 | Wt 327.9 lb

## 2024-02-06 DIAGNOSIS — R7303 Prediabetes: Secondary | ICD-10-CM

## 2024-02-06 DIAGNOSIS — G4733 Obstructive sleep apnea (adult) (pediatric): Secondary | ICD-10-CM

## 2024-02-06 DIAGNOSIS — F39 Unspecified mood [affective] disorder: Secondary | ICD-10-CM

## 2024-02-06 DIAGNOSIS — E782 Mixed hyperlipidemia: Secondary | ICD-10-CM

## 2024-02-06 DIAGNOSIS — I10 Essential (primary) hypertension: Secondary | ICD-10-CM | POA: Diagnosis not present

## 2024-02-06 MED ORDER — CITALOPRAM HYDROBROMIDE 20 MG PO TABS: 20.0000 mg | ORAL_TABLET | Freq: Every day | ORAL | 1 refills | Status: DC

## 2024-02-06 MED ORDER — HYDRALAZINE HCL 50 MG PO TABS: 50.0000 mg | ORAL_TABLET | Freq: Three times a day (TID) | ORAL | 0 refills | Status: DC

## 2024-02-06 MED ORDER — VALSARTAN-HYDROCHLOROTHIAZIDE 160-25 MG PO TABS: 1.0000 | ORAL_TABLET | Freq: Every day | ORAL | 0 refills | Status: DC

## 2024-02-06 MED ORDER — TIRZEPATIDE-WEIGHT MANAGEMENT 2.5 MG/0.5ML ~~LOC~~ SOLN: 2.5000 mg | SUBCUTANEOUS | 1 refills | Status: DC

## 2024-02-06 NOTE — Progress Notes (Signed)
 Established Patient Office Visit  Subjective   Patient ID: James Cooley, male    DOB: 04/10/1986  Age: 38 y.o. MRN: [id number]  Chief Complaint  Patient presents with   Medical Management of Chronic Issues    HTN and Triglycerides   Discussed the use of AI scribe software for clinical note transcription with the patient, who gave verbal consent to proceed.  History of Present Illness James Cooley is a 38 year old male with hypertension, hyperlipidemia, obstructive sleep apnea, morbid obesity, prediabetes, and anxiety mixed with depression who presents for chronic disease mgmt.  He has hypertension with home blood pressure readings generally in the low 130s/80s, occasionally reaching the 140s, attributed to lifestyle factors such as going to the beach. His current medications include amlodipine  5 mg, hydralazine  50 mg three times daily, and valsartan /hydrochlorothiazide  160/25 mg once daily. He sometimes misses his midday dose of hydralazine  but usually takes the morning and night doses.  His hyperlipidemia is managed with fenofibrate  145 mg daily, Zetia  10 mg daily, and rosuvastatin  40 mg. No new symptoms related to lipid levels have been reported.  He has obstructive sleep apnea and uses a CPAP machine, which remains effective. He experiences fatigue and has discussed with his sleep therapist the possibility of additional treatment options for his sleep apnea.  He has a history of morbid obesity with a BMI of 42.10 and a weight of 327 pounds, which has increased since January when his BMI was 39.91 and weight was 311 pounds. He engages in lifestyle interventions such as riding a Peloton three times a week, intermittent fasting, and being mindful of processed foods and sodium intake. Fasting has helped reduce swelling in his hands, allowing him to wear his ring more comfortably.  He has prediabetes and is currently on metformin  500 mg. No new symptoms related to blood sugar  levels have been reported.  He has anxiety mixed with depression and is taking Celexa  20 mg daily. No new symptoms related to his mental health have been reported.        02/06/2024    8:15 AM 11/23/2023    8:18 AM 08/05/2023    3:55 PM  Depression screen PHQ 2/9  Decreased Interest 1 0 0  Down, Depressed, Hopeless 0 0 0  PHQ - 2 Score 1 0 0  Altered sleeping   0  Tired, decreased energy   1  Change in appetite   0  Feeling bad or failure about yourself    0  Trouble concentrating   0  Moving slowly or fidgety/restless   0  Suicidal thoughts   0  PHQ-9 Score   1  Difficult doing work/chores   Not difficult at all       02/06/2024    8:15 AM 11/23/2023    8:19 AM 08/05/2023    3:56 PM 06/24/2023    3:55 PM  GAD 7 : Generalized Anxiety Score  Nervous, Anxious, on Edge 1 0 0 1  Control/stop worrying 0 0 0 0  Worry too much - different things 0 0 0 0  Trouble relaxing 0 0 0 0  Restless 0 0 0   Easily annoyed or irritable 1 0 1 1  Afraid - awful might happen 0 0 0 0  Total GAD 7 Score 2 0 1   Anxiety Difficulty Not difficult at all Not difficult at all  Not difficult at all     ROS  Negative unless indicated in HPI  Objective:     BP 134/82 (BP Location: Right Arm, Patient Position: Sitting, Cuff Size: Large)   Pulse 85   Resp 16   Wt (!) 327 lb 14.4 oz (148.7 kg)   SpO2 98%   BMI 42.10 kg/m    Physical Exam Constitutional:      General: He is not in acute distress.    Appearance: Normal appearance. He is obese. He is not ill-appearing, toxic-appearing or diaphoretic.  HENT:     Head: Normocephalic.     Right Ear: Tympanic membrane normal.     Left Ear: Tympanic membrane normal.     Nose: Nose normal.     Mouth/Throat:     Mouth: Mucous membranes are moist.  Eyes:     Extraocular Movements: Extraocular movements intact.     Conjunctiva/sclera: Conjunctivae normal.     Pupils: Pupils are equal, round, and reactive to light.  Neck:     Vascular: No  carotid bruit.  Cardiovascular:     Rate and Rhythm: Normal rate and regular rhythm.     Pulses: Normal pulses.     Heart sounds: Normal heart sounds. No murmur heard.    No friction rub. No gallop.  Pulmonary:     Effort: Pulmonary effort is normal. No respiratory distress.     Breath sounds: Normal breath sounds. No stridor. No wheezing, rhonchi or rales.  Chest:     Chest wall: No tenderness.  Musculoskeletal:        General: Normal range of motion.     Right lower leg: No edema.     Left lower leg: No edema.  Lymphadenopathy:     Cervical: No cervical adenopathy.  Skin:    General: Skin is warm and dry.     Capillary Refill: Capillary refill takes less than 2 seconds.     Coloration: Skin is not pale.  Neurological:     General: No focal deficit present.     Mental Status: He is alert and oriented to person, place, and time. Mental status is at baseline.     Cranial Nerves: No cranial nerve deficit.     Sensory: No sensory deficit.     Motor: No weakness.     Coordination: Coordination normal.     Gait: Gait normal.  Psychiatric:        Mood and Affect: Mood normal.        Behavior: Behavior normal.        Thought Content: Thought content normal.        Judgment: Judgment normal.      No results found for any visits on 02/06/24.    The ASCVD Risk score (Arnett DK, et al., 2019) failed to calculate for the following reasons:   The 2019 ASCVD risk score is only valid for ages 80 to 42    Assessment & Plan:  Elevated cholesterol with high triglycerides -     Lipid Panel With LDL/HDL Ratio  Mood disorder (HCC) -     Citalopram  Hydrobromide; Take 1 tablet (20 mg total) by mouth daily.  Dispense: 90 tablet; Refill: 1  Essential hypertension -     hydrALAZINE  HCl; Take 1 tablet (50 mg total) by mouth 3 (three) times daily.  Dispense: 270 tablet; Refill: 0 -     Comprehensive metabolic panel with GFR  Primary hypertension -     Valsartan -hydroCHLOROthiazide ; Take  1 tablet by mouth daily. TAKE IN PLACE OF LOSARTAN -HCTZ  Dispense: 90 tablet; Refill: 0  Prediabetes -     Hemoglobin A1c  OSA (obstructive sleep apnea) -     Tirzepatide -Weight Management; Inject 2.5 mg into the skin once a week.  Dispense: 2 mL; Refill: 1     Assessment and Plan Assessment & Plan Obstructive Sleep Apnea Using CPAP. Interested in Zepbound  for sleep apnea. Fatigue present. Weight loss efforts ongoing.Given chronic hx of OSA, continued daily fatigue, complicated by morbid obesity. Has been working pulmonary/ sleep therapist would recommends trying tirzeptide. Will trial tirzepatide  2.5 mg weekly. For OSA mgmt - If approved plan for pt to follow up sooner to assess for side effects of medication and pt's tolerance. - continue nightly CPAP  Hypertension Well-controlled with home readings in high 120s/low 130s over high 70s/low 80s. Current regimen includes amlodipine  5mg , hydralazine  50 mg TID, and valsartan /hydrochlorothiazide  160/25 daily. Recent medication adjustments improved edema. - Continue current antihypertensive regimen. - Check CMP, A1c, and lipid panel. - Follow up in 3-4 months for blood pressure check.  Morbid Obesity BMI 42.10, weight 327 lbs, increased since January. Engaging in lifestyle interventions including intermittent fasting, exercise, and sodium reduction - Encourage continuation of lifestyle interventions including exercise and dietary modifications. - Recommend nutritionist, working with [employer] program like [employer].  Hyperlipidemia Managed with fenofibrate , Zetia , and rosuvastatin . Lifestyle modifications ongoing. - Continue current lipid-lowering medications. - Check lipid panel.  Anxiety and Depression Anxiety mixed with depression. On Celexa  20 mg daily. No new concerns reported. - Refill Celexa  20 mg.   Return in about 4 months (around 06/08/2024) for BP mgmt.   I, Curtis DELENA Boom, FNP, have reviewed all documentation for this  visit. The documentation on 02/06/24 for the exam, diagnosis, procedures, and orders are all accurate and complete.   Curtis DELENA Boom, FNP

## 2024-02-07 ENCOUNTER — Ambulatory Visit: Payer: Self-pay | Admitting: Family Medicine

## 2024-02-07 LAB — COMPREHENSIVE METABOLIC PANEL WITH GFR
ALT: 47 IU/L — ABNORMAL HIGH (ref 0–44)
AST: 33 IU/L (ref 0–40)
Albumin: 4.6 g/dL (ref 4.1–5.1)
Alkaline Phosphatase: 74 IU/L (ref 44–121)
BUN/Creatinine Ratio: 9 (ref 9–20)
BUN: 11 mg/dL (ref 6–20)
Bilirubin Total: 0.4 mg/dL (ref 0.0–1.2)
CO2: 21 mmol/L (ref 20–29)
Calcium: 9.2 mg/dL (ref 8.7–10.2)
Chloride: 100 mmol/L (ref 96–106)
Creatinine, Ser: 1.22 mg/dL (ref 0.76–1.27)
Globulin, Total: 2.2 g/dL (ref 1.5–4.5)
Glucose: 96 mg/dL (ref 70–99)
Potassium: 3.7 mmol/L (ref 3.5–5.2)
Sodium: 139 mmol/L (ref 134–144)
Total Protein: 6.8 g/dL (ref 6.0–8.5)
eGFR: 78 mL/min/1.73 (ref >59)

## 2024-02-07 LAB — HEMOGLOBIN A1C
Est. average glucose Bld gHb Est-mCnc: 114 mg/dL
Hgb A1c MFr Bld: 5.6 % (ref 4.8–5.6)

## 2024-02-07 LAB — LIPID PANEL WITH LDL/HDL RATIO
Cholesterol, Total: 78 mg/dL — ABNORMAL LOW (ref 100–199)
HDL: 29 mg/dL — ABNORMAL LOW (ref >39)
LDL Chol Calc (NIH): 9 mg/dL (ref 0–99)
LDL/HDL Ratio: 0.3 ratio (ref 0.0–3.6)
Triglycerides: 277 mg/dL — ABNORMAL HIGH (ref 0–149)
VLDL Cholesterol Cal: 40 mg/dL (ref 5–40)

## 2024-02-14 ENCOUNTER — Other Ambulatory Visit: Payer: Self-pay | Admitting: Family Medicine

## 2024-02-14 DIAGNOSIS — R7303 Prediabetes: Secondary | ICD-10-CM

## 2024-02-14 MED ORDER — METFORMIN HCL ER 750 MG PO TB24: 750.0000 mg | ORAL_TABLET | Freq: Every day | ORAL | 3 refills | Status: DC

## 2024-02-16 ENCOUNTER — Telehealth: Payer: Self-pay

## 2024-02-16 ENCOUNTER — Other Ambulatory Visit (HOSPITAL_COMMUNITY): Payer: Self-pay

## 2024-02-16 NOTE — Telephone Encounter (Signed)
 Attempted PA for Zepbound  which CMM cancelled due to the following message:   Message from [facility]: Drug is not covered by plan

## 2024-02-16 NOTE — Telephone Encounter (Signed)
 Pharmacy Patient Advocate Encounter   Received notification from Physician's Office that prior authorization for Zepbound  2.5MG /0.5ML pen-injectors is required/requested.   Insurance verification completed.   The patient is insured through [employer] .   Per test claim: PA required and submitted KEY/EOC/Request #: [id number] due to   Message from [facility]: Drug is not covered by plan

## 2024-03-20 ENCOUNTER — Encounter: Payer: Self-pay | Admitting: Family Medicine

## 2024-04-16 ENCOUNTER — Telehealth: Payer: Self-pay | Admitting: Family Medicine

## 2024-04-16 NOTE — Telephone Encounter (Signed)
 [facility] faxed refill request for the following medications:  Metformin  HCL 500mg  tabs    Please advise.

## 2024-04-16 NOTE — Telephone Encounter (Signed)
 Requesting too soon

## 2024-04-23 ENCOUNTER — Telehealth: Payer: Self-pay | Admitting: Family Medicine

## 2024-04-23 ENCOUNTER — Other Ambulatory Visit: Payer: Self-pay

## 2024-04-23 MED ORDER — ROSUVASTATIN CALCIUM 40 MG PO TABS: 40.0000 mg | ORAL_TABLET | Freq: Every day | ORAL | 3 refills | Status: AC

## 2024-04-23 MED ORDER — FENOFIBRATE 145 MG PO TABS: 145.0000 mg | ORAL_TABLET | Freq: Every day | ORAL | 3 refills | Status: AC

## 2024-04-23 MED ORDER — EZETIMIBE 10 MG PO TABS: 10.0000 mg | ORAL_TABLET | Freq: Every day | ORAL | 3 refills | Status: AC

## 2024-04-23 NOTE — Telephone Encounter (Addendum)
 [facility] faxed refill request for the following medications:  rosuvastatin  (CRESTOR ) 40 MG tablet   ezetimibe  (ZETIA ) 10 MG tablet   fenofibrate  (TRICOR ) 145 MG tablet    Please advise.

## 2024-04-23 NOTE — Telephone Encounter (Signed)
 Converted and refeills sent to pharmacy

## 2024-04-27 ENCOUNTER — Telehealth: Payer: Self-pay | Admitting: Family Medicine

## 2024-04-27 DIAGNOSIS — R7303 Prediabetes: Secondary | ICD-10-CM

## 2024-04-27 NOTE — Telephone Encounter (Signed)
 [facility] faxed refill request for the following medications:  Metformin  HCL ER 500mg  tabs    Please advise.

## 2024-04-27 NOTE — Telephone Encounter (Signed)
 Patient currently taking 750mg  metformin  daily, not 500. E2C2- If pharmacy calls back please advise of this, also let them know the 750 was refilled 02/14/2024 for 90 and 3 rf

## 2024-05-28 ENCOUNTER — Telehealth: Payer: Self-pay | Admitting: Family Medicine

## 2024-05-28 NOTE — Telephone Encounter (Signed)
[facility] faxed refill request for the following medications:  amLODipine (NORVASC) 10 MG tablet    Please advise.

## 2024-05-30 ENCOUNTER — Other Ambulatory Visit: Payer: Self-pay

## 2024-05-30 MED ORDER — AMLODIPINE BESYLATE 5 MG PO TABS: 5.0000 mg | ORAL_TABLET | Freq: Every day | ORAL | 1 refills | Status: AC

## 2024-05-30 NOTE — Telephone Encounter (Signed)
 Done

## 2024-06-01 NOTE — Progress Notes (Unsigned)
 [facility] [address] [place], [place] [address]  Pulmonary Sleep Medicine   Office Visit Note  Patient Name: James Cooley DOB: March 17, 1986 MRN [id number]    Chief Complaint: Obstructive Sleep Apnea visit  Brief History:  James Cooley is seen today for a 6 month follow up visit for APAP@ 4-20 cmH2O. The patient has a 10 month history of sleep apnea. Patient is using PAP nightly.  The patient feels rested after sleeping with PAP.  The patient reports benefiting from PAP use. Reported sleepiness is  improved and the Epworth Sleepiness Score is 5 out of 24. The patient does not take naps. The patient complains of the following: pt is in need of a new water chamber.  The compliance download shows 97% compliance with an average use time of 7 hours 17 minutes. The AHI is 1.1.  The patient does not complain of limb movements disrupting sleep. The patient continues to require PAP therapy in order to eliminate sleep apnea.   ROS  General: (-) fever, (-) chills, (-) night sweat Nose and Sinuses: (-) nasal stuffiness or itchiness, (-) postnasal drip, (-) nosebleeds, (-) sinus trouble. Mouth and Throat: (-) sore throat, (-) hoarseness. Neck: (-) swollen glands, (-) enlarged thyroid, (-) neck pain. Respiratory: - cough, - shortness of breath, - wheezing. Neurologic: - numbness, - tingling. Psychiatric: - anxiety, - depression   Current Medication: Outpatient Encounter Medications as of 06/04/2024  Medication Sig   amLODipine  (NORVASC ) 5 MG tablet Take 1 tablet (5 mg total) by mouth daily.   citalopram  (CELEXA ) 20 MG tablet Take 1 tablet (20 mg total) by mouth daily.   ezetimibe  (ZETIA ) 10 MG tablet Take 1 tablet (10 mg total) by mouth daily.   fenofibrate  (TRICOR ) 145 MG tablet Take 1 tablet (145 mg total) by mouth daily.   hydrALAZINE  (APRESOLINE ) 50 MG tablet Take 1 tablet (50 mg total) by mouth 3 (three) times daily.   metFORMIN  (GLUCOPHAGE -XR) 750 MG 24 hr tablet Take 1  tablet (750 mg total) by mouth daily with breakfast.   rosuvastatin  (CRESTOR ) 40 MG tablet Take 1 tablet (40 mg total) by mouth daily.   valsartan -hydrochlorothiazide  (DIOVAN -HCT) 160-25 MG tablet Take 1 tablet by mouth daily. TAKE IN PLACE OF LOSARTAN -HCTZ   [DISCONTINUED] tirzepatide  (ZEPBOUND ) 2.5 MG/0.5ML injection vial Inject 2.5 mg into the skin once a week.   No facility-administered encounter medications on file as of 06/04/2024.    Surgical History: Past Surgical History:  Procedure Laterality Date   NO PAST SURGERIES      Medical History: Past Medical History:  Diagnosis Date   Anxiety    Asthma    Hypertension     Family History: Non contributory to the present illness  Social History: Social History   Socioeconomic History   Marital status: Married    Spouse name: Not on file   Number of children: Not on file   Years of education: Not on file   Highest education level: Doctorate  Occupational History   Not on file  Tobacco Use   Smoking status: Never   Smokeless tobacco: Never  Vaping Use   Vaping status: Never Used  Substance and Sexual Activity   Alcohol use: Yes    Comment: 1 per day   Drug use: No   Sexual activity: Yes  Other Topics Concern   Not on file  Social History Narrative   Not on file   Social Drivers of Health   Financial Resource Strain: Low Risk  (  02/05/2024)   Overall Financial Resource Strain (CARDIA)    Difficulty of Paying Living Expenses: Not hard at all  Food Insecurity: No Food Insecurity (02/05/2024)   Hunger Vital Sign    Worried About Running Out of Food in the Last Year: Never true    Ran Out of Food in the Last Year: Never true  Transportation Needs: No Transportation Needs (02/05/2024)   PRAPARE - Administrator, Civil Service (Medical): No    Lack of Transportation (Non-Medical): No  Physical Activity: Insufficiently Active (02/05/2024)   Exercise Vital Sign    Days of Exercise per Week: 3 days     Minutes of Exercise per Session: 20 min  Stress: No Stress Concern Present (02/05/2024)   [employer] of Occupational Health - Occupational Stress Questionnaire    Feeling of Stress: Only a little  Social Connections: Socially Integrated (02/05/2024)   Social Connection and Isolation Panel    Frequency of Communication with Friends and Family: Once a week    Frequency of Social Gatherings with Friends and Family: Twice a week    Attends Religious Services: More than 4 times per year    Active Member of [employer] or Organizations: Yes    Attends Banker Meetings: 1 to 4 times per year    Marital Status: Married  Catering Manager Violence: Not on file    Vital Signs: Blood pressure (!) 147/89, pulse 65, resp. rate 16, height 6' 2 (1.88 m), weight (!) 329 lb (149.2 kg), SpO2 98%. Body mass index is 42.24 kg/m.    Examination: General Appearance: The patient is well-developed, well-nourished, and in no distress. Neck Circumference: 46 cm Skin: Gross inspection of skin unremarkable. Head: normocephalic, no gross deformities. Eyes: no gross deformities noted. ENT: ears appear grossly normal Neurologic: Alert and oriented. No involuntary movements.  STOP BANG RISK ASSESSMENT S (snore) Have you been told that you snore?     NO   T (tired) Are you often tired, fatigued, or sleepy during the day?   NO  O (obstruction) Do you stop breathing, choke, or gasp during sleep? NO   P (pressure) Do you have or are you being treated for high blood pressure? YES   B (BMI) Is your body index greater than 35 kg/m? YES   A (age) Are you 45 years old or older? NO   N (neck) Do you have a neck circumference greater than 16 inches?   YES   G (gender) Are you a male? YES   TOTAL STOP/BANG "YES" ANSWERS 4       A STOP-Bang score of 2 or less is considered low risk, and a score of 5 or more is high risk for having either moderate or severe OSA. For people who score 3 or 4, doctors  may need to perform further assessment to determine how likely they are to have OSA.         EPWORTH SLEEPINESS SCALE:  Scale:  (0)= no chance of dozing; (1)= slight chance of dozing; (2)= moderate chance of dozing; (3)= high chance of dozing  Chance  Situtation    Sitting and reading: 1    Watching TV: 1    Sitting Inactive in public: 0    As a passenger in car: 2      Lying down to rest: 1    Sitting and talking: 0    Sitting quielty after lunch: 0    In a car, stopped in traffic:  0   TOTAL SCORE:   5 out of 24    SLEEP STUDIES:  HST (07/2023) AHI 27.3/hr, min SpO2 81%   CPAP COMPLIANCE DATA:  Date Range: 12/03/2023-05/30/2024  Average Daily Use: 7 hours 17 minutes  Median Use: 7 hours 18 minutes  Compliance for > 4 Hours: 97%  AHI: 1.1 respiratory events per hour  Days Used: 180/180 days  Mask Leak: 11.2  95th Percentile Pressure: 12.6         LABS: No results found for this or any previous visit (from the past 2160 hours).  Radiology: DG Chest 2 View Result Date: 12/10/2021 CLINICAL DATA:  Cough, shortness of breath EXAM: CHEST - 2 VIEW COMPARISON:  07/16/2020 FINDINGS: The heart size and mediastinal contours are within normal limits. New left lower lobe airspace opacity. Right lung is clear. No pleural effusion or pneumothorax. The visualized skeletal structures are unremarkable. IMPRESSION: Left lower lobe pneumonia. Electronically Signed   By: Mabel Converse D.O.   On: 12/10/2021 15:04    No results found.  No results found.    Assessment and Plan: Patient Active Problem List   Diagnosis Date Noted   CPAP use counseling 11/28/2023   Prediabetes 08/05/2023   OSA (obstructive sleep apnea) 06/24/2023   Mood disorder 05/12/2023   Metabolic syndrome 04/16/2022   Annual physical exam 01/06/2022   Essential hypertension 01/06/2022   Hypercholesteremia 01/06/2022   Hypertriglyceridemia 01/06/2022   Family history of diabetes  mellitus (DM) 01/06/2022   Morbid obesity (HCC) 01/06/2022   Hypertension 05/21/2015   Kidney stones 05/21/2015    1. OSA (obstructive sleep apnea) (Primary) The patient does tolerate PAP and reports  benefit from PAP use. The patient was reminded how to clean equipment and advised to replace supplies routinely. The patient was also counselled on weight loss. The compliance is excellent. The AHI is 1.1.   OSA on cpap- controlled. Continue with excellent compliance with pap. CPAP continues to be medically necessary to treat this patient's OSA. F/u one year.    2. CPAP use counseling CPAP Counseling: had a lengthy discussion with the patient regarding the importance of PAP therapy in management of the sleep apnea. Patient appears to understand the risk factor reduction and also understands the risks associated with untreated sleep apnea. Patient will try to make a good faith effort to remain compliant with therapy. Also instructed the patient on proper cleaning of the device including the water must be changed daily if possible and use of distilled water is preferred. Patient understands that the machine should be regularly cleaned with appropriate recommended cleaning solutions that do not damage the PAP machine for example given white vinegar and water rinses. Other methods such as ozone treatment may not be as good as these simple methods to achieve cleaning.   3. Hypertension, unspecified type Hypertension Counseling:   The following hypertensive lifestyle modification were recommended and discussed:  1. Limiting alcohol intake to less than 1 oz/day of ethanol:(24 oz of beer or 8 oz of wine or 2 oz of 100-proof whiskey). 2. Take baby ASA 81 mg daily. 3. Importance of regular aerobic exercise and losing weight. 4. Reduce dietary saturated fat and cholesterol intake for overall cardiovascular health. 5. Maintaining adequate dietary potassium, calcium , and magnesium intake. 6. Regular  monitoring of the blood pressure. 7. Reduce sodium intake to less than 100 mmol/day (less than 2.3 gm of sodium or less than 6 gm of sodium choride)      General Counseling:  I have discussed the findings of the evaluation and examination with Mabel.  I have also discussed any further diagnostic evaluation thatmay be needed or ordered today. Lourdes verbalizes understanding of the findings of todays visit. We also reviewed his medications today and discussed drug interactions and side effects including but not limited excessive drowsiness and altered mental states. We also discussed that there is always a risk not just to him but also people around him. he has been encouraged to call the office with any questions or concerns that should arise related to todays visit.  No orders of the defined types were placed in this encounter.       I have personally obtained a history, examined the patient, evaluated laboratory and imaging results, formulated the assessment and plan and placed orders. This patient was seen today by Lauraine Lay, PA-C in collaboration with Dr. Elfreda Bathe.   Elfreda DELENA Bathe, MD [facility] Diplomate ABMS Pulmonary Critical Care Medicine and Sleep Medicine

## 2024-06-04 ENCOUNTER — Ambulatory Visit (INDEPENDENT_AMBULATORY_CARE_PROVIDER_SITE_OTHER): Admitting: Internal Medicine

## 2024-06-04 VITALS — BP 147/89 | HR 65 | Resp 16 | Ht 74.0 in | Wt 329.0 lb

## 2024-06-04 DIAGNOSIS — G4733 Obstructive sleep apnea (adult) (pediatric): Secondary | ICD-10-CM

## 2024-06-04 DIAGNOSIS — I10 Essential (primary) hypertension: Secondary | ICD-10-CM | POA: Diagnosis not present

## 2024-06-04 DIAGNOSIS — Z7189 Other specified counseling: Secondary | ICD-10-CM

## 2024-06-04 NOTE — Patient Instructions (Signed)

## 2024-06-05 ENCOUNTER — Ambulatory Visit: Admitting: Urology

## 2024-06-05 ENCOUNTER — Encounter: Payer: Self-pay | Admitting: Urology

## 2024-06-05 VITALS — BP 119/72 | HR 75 | Ht 74.0 in | Wt 327.0 lb

## 2024-06-05 DIAGNOSIS — Z3009 Encounter for other general counseling and advice on contraception: Secondary | ICD-10-CM

## 2024-06-05 NOTE — Progress Notes (Signed)
 06/05/2024 8:22 AM   Mabel Haas 10-16-1985 [id number]  Referring provider: Wellington Curtis LABOR, FNP No address on file  Chief Complaint  Patient presents with   VAS Consult    HPI: Stephanie Mcglone is a 38 y.o. male who presents for vasectomy counseling.  Married with 2 children and his wife is pregnant with their third Denies prior history chronic scrotal pain, epididymitis or orchitis No previous history inguinal hernia or pelvic surgery No history of bleeding or clotting disorders   PMH: Past Medical History:  Diagnosis Date   Anxiety    Asthma    Hypertension     Surgical History: Past Surgical History:  Procedure Laterality Date   NO PAST SURGERIES      Home Medications:  Allergies as of 06/05/2024       Reactions   Amoxicillin Rash        Medication List        Accurate as of June 05, 2024  8:22 AM. If you have any questions, ask your nurse or doctor.          amLODipine  5 MG tablet Commonly known as: NORVASC  Take 1 tablet (5 mg total) by mouth daily.   citalopram  20 MG tablet Commonly known as: CELEXA  Take 1 tablet (20 mg total) by mouth daily.   ezetimibe  10 MG tablet Commonly known as: Zetia  Take 1 tablet (10 mg total) by mouth daily.   fenofibrate  145 MG tablet Commonly known as: Tricor  Take 1 tablet (145 mg total) by mouth daily.   hydrALAZINE  50 MG tablet Commonly known as: APRESOLINE  Take 1 tablet (50 mg total) by mouth 3 (three) times daily.   metFORMIN  750 MG 24 hr tablet Commonly known as: GLUCOPHAGE -XR Take 1 tablet (750 mg total) by mouth daily with breakfast.   rosuvastatin  40 MG tablet Commonly known as: CRESTOR  Take 1 tablet (40 mg total) by mouth daily.   valsartan -hydrochlorothiazide  160-25 MG tablet Commonly known as: DIOVAN -HCT Take 1 tablet by mouth daily. TAKE IN PLACE OF LOSARTAN -HCTZ        Allergies:  Allergies  Allergen Reactions   Amoxicillin Rash    Family History: Family  History  Problem Relation Age of Onset   Breast cancer Mother    Pneumonia Mother    Cancer Mother    Hypertension Father    Sleep apnea Father    Depression Paternal Grandmother    Mental illness Paternal Grandmother    Sleep apnea Brother    Diabetes Maternal Grandfather    Lung cancer Maternal Grandfather    Cancer Maternal Grandfather     Social History:  reports that he has never smoked. He has never used smokeless tobacco. He reports current alcohol use. He reports that he does not use drugs.   Physical Exam: BP 119/72   Pulse 75   Ht 6' 2 (1.88 m)   Wt (!) 327 lb (148.3 kg)   BMI 41.98 kg/m   Constitutional:  Alert and oriented, No acute distress. HEENT: [place] AT Respiratory: Normal respiratory effort, no increased work of breathing. GI: Abdomen is soft, nontender, nondistended, no abdominal masses GU: Phallus without lesions, testes descended bilaterally without masses or tenderness.  Extreme spermatic cord thickness bilaterally with a contracted scrotum and unable to palpate vasa Psychiatric: Normal mood and affect.   Assessment & Plan:    1.  Undesired fertility Desires to schedule vasectomy however due to thick spermatic cords unable to palpate the vas and would recommend performing sedation.  If needed he is agreeable to an extended incision. We had a long discussion about vasectomy. We specifically discussed the procedure, recovery and the risks, benefits and alternatives of vasectomy. I explained that the procedure entails removal of a segment of each vas deferens, each of which conducts sperm, and that the purpose of this procedure is to cause sterility (inability to produce children or cause pregnancy). Vasectomy is intended to be permanent and irreversible form of contraception. Options for fertility after vasectomy include vasectomy reversal, or sperm retrieval with in vitro fertilization. These options are not always successful, and they may be expensive. We  discussed reversible forms of birth control such as condoms, IUD or diaphragms, as well as the option of freezing sperm in a sperm bank prior to the vasectomy procedure. We discussed the importance of avoiding strenuous exercise for four days after vasectomy, and the importance of refraining from any form of ejaculation for seven days after vasectomy. I explained that vasectomy does not produce immediate sterility so another form of contraceptive must be used until sterility is assured by having semen checked for sperm. Thus, a post vasectomy semen analysis is necessary to confirm sterility. Rarely, vasectomy must be repeated. We discussed the approximately 1 in 2,000 risk of pregnancy after vasectomy for men who have post-vasectomy semen analysis showing absent sperm or rare non-motile sperm. Typical side effects include a small amount of oozing blood, some discomfort and mild swelling in the area of incision.  Vasectomy does not affect sexual performance, function, please, sensation, interest, desire, satisfaction, penile erection, volume of semen or ejaculation. Other rare risks include allergy or adverse reaction to an anesthetic, testicular atrophy, hematoma, infection/abscess, prolonged tenderness of the vas deferens, pain, swelling, painful nodule or scar (called sperm granuloma) or epididymtis. We discussed chronic testicular pain syndrome. This has been reported to occur in as many as 1-2% of men and may be permanent. This can be treated with medication, small procedures or (rarely) surgery.    Glendia JAYSON Barba, MD  [facility] [address], [address] [place], [place] [address] [phone]

## 2024-06-05 NOTE — H&P (View-Only) (Signed)
 06/05/2024 8:22 AM   Mabel Haas 10-16-1985 [id number]  Referring provider: Wellington Curtis LABOR, FNP No address on file  Chief Complaint  Patient presents with   VAS Consult    HPI: Stephanie Mcglone is a 38 y.o. male who presents for vasectomy counseling.  Married with 2 children and his wife is pregnant with their third Denies prior history chronic scrotal pain, epididymitis or orchitis No previous history inguinal hernia or pelvic surgery No history of bleeding or clotting disorders   PMH: Past Medical History:  Diagnosis Date   Anxiety    Asthma    Hypertension     Surgical History: Past Surgical History:  Procedure Laterality Date   NO PAST SURGERIES      Home Medications:  Allergies as of 06/05/2024       Reactions   Amoxicillin Rash        Medication List        Accurate as of June 05, 2024  8:22 AM. If you have any questions, ask your nurse or doctor.          amLODipine  5 MG tablet Commonly known as: NORVASC  Take 1 tablet (5 mg total) by mouth daily.   citalopram  20 MG tablet Commonly known as: CELEXA  Take 1 tablet (20 mg total) by mouth daily.   ezetimibe  10 MG tablet Commonly known as: Zetia  Take 1 tablet (10 mg total) by mouth daily.   fenofibrate  145 MG tablet Commonly known as: Tricor  Take 1 tablet (145 mg total) by mouth daily.   hydrALAZINE  50 MG tablet Commonly known as: APRESOLINE  Take 1 tablet (50 mg total) by mouth 3 (three) times daily.   metFORMIN  750 MG 24 hr tablet Commonly known as: GLUCOPHAGE -XR Take 1 tablet (750 mg total) by mouth daily with breakfast.   rosuvastatin  40 MG tablet Commonly known as: CRESTOR  Take 1 tablet (40 mg total) by mouth daily.   valsartan -hydrochlorothiazide  160-25 MG tablet Commonly known as: DIOVAN -HCT Take 1 tablet by mouth daily. TAKE IN PLACE OF LOSARTAN -HCTZ        Allergies:  Allergies  Allergen Reactions   Amoxicillin Rash    Family History: Family  History  Problem Relation Age of Onset   Breast cancer Mother    Pneumonia Mother    Cancer Mother    Hypertension Father    Sleep apnea Father    Depression Paternal Grandmother    Mental illness Paternal Grandmother    Sleep apnea Brother    Diabetes Maternal Grandfather    Lung cancer Maternal Grandfather    Cancer Maternal Grandfather     Social History:  reports that he has never smoked. He has never used smokeless tobacco. He reports current alcohol use. He reports that he does not use drugs.   Physical Exam: BP 119/72   Pulse 75   Ht 6' 2 (1.88 m)   Wt (!) 327 lb (148.3 kg)   BMI 41.98 kg/m   Constitutional:  Alert and oriented, No acute distress. HEENT: [place] AT Respiratory: Normal respiratory effort, no increased work of breathing. GI: Abdomen is soft, nontender, nondistended, no abdominal masses GU: Phallus without lesions, testes descended bilaterally without masses or tenderness.  Extreme spermatic cord thickness bilaterally with a contracted scrotum and unable to palpate vasa Psychiatric: Normal mood and affect.   Assessment & Plan:    1.  Undesired fertility Desires to schedule vasectomy however due to thick spermatic cords unable to palpate the vas and would recommend performing sedation.  If needed he is agreeable to an extended incision. We had a long discussion about vasectomy. We specifically discussed the procedure, recovery and the risks, benefits and alternatives of vasectomy. I explained that the procedure entails removal of a segment of each vas deferens, each of which conducts sperm, and that the purpose of this procedure is to cause sterility (inability to produce children or cause pregnancy). Vasectomy is intended to be permanent and irreversible form of contraception. Options for fertility after vasectomy include vasectomy reversal, or sperm retrieval with in vitro fertilization. These options are not always successful, and they may be expensive. We  discussed reversible forms of birth control such as condoms, IUD or diaphragms, as well as the option of freezing sperm in a sperm bank prior to the vasectomy procedure. We discussed the importance of avoiding strenuous exercise for four days after vasectomy, and the importance of refraining from any form of ejaculation for seven days after vasectomy. I explained that vasectomy does not produce immediate sterility so another form of contraceptive must be used until sterility is assured by having semen checked for sperm. Thus, a post vasectomy semen analysis is necessary to confirm sterility. Rarely, vasectomy must be repeated. We discussed the approximately 1 in 2,000 risk of pregnancy after vasectomy for men who have post-vasectomy semen analysis showing absent sperm or rare non-motile sperm. Typical side effects include a small amount of oozing blood, some discomfort and mild swelling in the area of incision.  Vasectomy does not affect sexual performance, function, please, sensation, interest, desire, satisfaction, penile erection, volume of semen or ejaculation. Other rare risks include allergy or adverse reaction to an anesthetic, testicular atrophy, hematoma, infection/abscess, prolonged tenderness of the vas deferens, pain, swelling, painful nodule or scar (called sperm granuloma) or epididymtis. We discussed chronic testicular pain syndrome. This has been reported to occur in as many as 1-2% of men and may be permanent. This can be treated with medication, small procedures or (rarely) surgery.    Glendia JAYSON Barba, MD  [facility] [address], [address] [place], [place] [address] [phone]

## 2024-06-06 ENCOUNTER — Other Ambulatory Visit: Payer: Self-pay

## 2024-06-06 ENCOUNTER — Other Ambulatory Visit: Payer: Self-pay | Admitting: Urology

## 2024-06-06 DIAGNOSIS — N491 Inflammatory disorders of spermatic cord, tunica vaginalis and vas deferens: Secondary | ICD-10-CM

## 2024-06-06 DIAGNOSIS — Z302 Encounter for sterilization: Secondary | ICD-10-CM

## 2024-06-06 NOTE — Progress Notes (Signed)
 Surgical Physician Order Form [facility]  Dr. Glendia Barba, MD  * Scheduling expectation : Wants to get done in December  *Length of Case: 45 minutes  *Clearance needed: no  *Anticoagulation Instructions: N/A  *Aspirin Instructions: N/A  *Post-op visit Date/Instructions: Postvasectomy semen analysis 3 months postop  *Diagnosis: Undesired fertility; thick spermatic cord and unable to palpate vas  *Procedure:  Vasectomy (44749); possible scrotal exploration   Additional orders: N/A  -Admit type: OUTpatient  -Anesthesia: MAC  -VTE Prophylaxis Standing Order SCD's       Other:   -Standing Lab Orders Per Anesthesia    Lab other: None  -Standing Test orders EKG/Chest x-ray per Anesthesia       Test other:   - Medications:  Ancef 2gm IV  -Other orders:  N/A

## 2024-06-11 ENCOUNTER — Other Ambulatory Visit: Payer: Self-pay

## 2024-06-11 ENCOUNTER — Ambulatory Visit: Admitting: Family Medicine

## 2024-06-11 ENCOUNTER — Telehealth: Payer: Self-pay | Admitting: Family Medicine

## 2024-06-11 DIAGNOSIS — I10 Essential (primary) hypertension: Secondary | ICD-10-CM

## 2024-06-11 MED ORDER — VALSARTAN-HYDROCHLOROTHIAZIDE 160-25 MG PO TABS: 1.0000 | ORAL_TABLET | Freq: Every day | ORAL | 0 refills | Status: DC

## 2024-06-11 NOTE — Telephone Encounter (Signed)
 Converted to rx request

## 2024-06-11 NOTE — Telephone Encounter (Signed)
 LOV- 02/06/2024 NOV- 06/20/2024 LRF- 02/06/2024 Outpatient Medication Detail   Disp Refills Start End   valsartan -hydrochlorothiazide  (DIOVAN -HCT) 160-25 MG tablet 90 tablet 0 02/06/2024 --   Sig - Route: Take 1 tablet by mouth daily. TAKE IN PLACE OF LOSARTAN -HCTZ - Oral   Sent to pharmacy as: valsartan -hydrochlorothiazide  (DIOVAN -HCT) 160-25 MG tablet   Notes to Pharmacy: Please cancel any remaining refills of losartan -hctz   E-Prescribing Status: Receipt confirmed by pharmacy (02/06/2024  8:36 AM EDT)

## 2024-06-11 NOTE — Telephone Encounter (Signed)
 [facility] faxed refill request for the following medications:  valsartan -hydrochlorothiazide  (DIOVAN -HCT) 160-25 MG tablet     Please advise.

## 2024-06-12 ENCOUNTER — Telehealth: Payer: Self-pay | Admitting: Family Medicine

## 2024-06-12 ENCOUNTER — Other Ambulatory Visit: Payer: Self-pay

## 2024-06-12 ENCOUNTER — Telehealth: Payer: Self-pay

## 2024-06-12 DIAGNOSIS — I10 Essential (primary) hypertension: Secondary | ICD-10-CM

## 2024-06-12 DIAGNOSIS — R7303 Prediabetes: Secondary | ICD-10-CM

## 2024-06-12 MED ORDER — HYDRALAZINE HCL 50 MG PO TABS: 50.0000 mg | ORAL_TABLET | Freq: Three times a day (TID) | ORAL | 0 refills | Status: DC

## 2024-06-12 MED ORDER — METFORMIN HCL ER 750 MG PO TB24: 750.0000 mg | ORAL_TABLET | Freq: Every evening | ORAL | 0 refills | Status: DC

## 2024-06-12 MED ORDER — VALSARTAN-HYDROCHLOROTHIAZIDE 160-25 MG PO TABS: 1.0000 | ORAL_TABLET | Freq: Every day | ORAL | 0 refills | Status: DC

## 2024-06-12 NOTE — Progress Notes (Signed)
   [facility] Surgical Posting Form  Surgery Date: Date: 06/21/2024  Surgeon: Dr. Glendia Barba, MD  Inpt ( No  )   Outpt (Yes)   Obs ( No  )   Diagnosis: Z30.2 Undesired Fertility, N49.1 Thick Spermatic Cord  -CPT: 55250, [phone]  Surgery: Bilateral Vasectomy with Possible Scrotal Exploration  Stop Anticoagulations: Yes  Cardiac/Medical/Pulmonary Clearance needed: no  *Orders entered into EPIC  Date: 06/12/24   *Case booked in [place]  Date: 06/12/24  *Notified pt of Surgery: Date: 06/12/24  PRE-OP UA & CX: no  *Placed into Prior Authorization Work Mart Date: 06/12/24  Assistant/laser/rep:No

## 2024-06-12 NOTE — Telephone Encounter (Signed)
 Received another request for this medication from [facility].  Looks like it was sent to the wrong pharmacy.  Please send to correct pharmacy.

## 2024-06-12 NOTE — Telephone Encounter (Signed)
 [facility] faxed refill request for the following medications:   hydrALAZINE  (APRESOLINE ) 50 MG tablet     metFORMIN  (GLUCOPHAGE -XR) 750 MG 24 hr tablet     Please advise.

## 2024-06-12 NOTE — Telephone Encounter (Signed)
 Per Dr. Twylla, Patient is to be scheduled for Bilateral Vasectomy with Possible Scrotal Exploration   James Cooley was contacted and possible surgical dates were discussed, Thursday December 11th, 2025 was agreed upon for surgery.   Patient was directed to call [phone] between 1-3pm the day before surgery to find out surgical arrival time.  Instructions were given not to eat or drink from midnight on the night before surgery and have a driver for the day of surgery. On the surgery day patient was instructed to enter through the Medical Mall entrance of [facility] report the Same Day Surgery desk.   Pre-Admit Testing will be in contact via phone to set up an interview with the anesthesia team to review your history and medications prior to surgery.   Reminder of this information was sent via MyChart to the patient.

## 2024-06-12 NOTE — Telephone Encounter (Signed)
 Converted to rx request and sent to correct pharmacy.

## 2024-06-14 ENCOUNTER — Telehealth: Payer: Self-pay | Admitting: Family Medicine

## 2024-06-14 NOTE — Telephone Encounter (Signed)
 [facility] is requesting 3 RF on  Amlodipine  Besylate 10 mg. #90

## 2024-06-14 NOTE — Telephone Encounter (Signed)
 6 month supply provided to patient on 05/30/2024

## 2024-06-18 ENCOUNTER — Other Ambulatory Visit: Payer: Self-pay

## 2024-06-18 ENCOUNTER — Inpatient Hospital Stay: Admission: RE | Admit: 2024-06-18 | Discharge: 2024-06-18 | Attending: Urology | Admitting: Urology

## 2024-06-18 ENCOUNTER — Telehealth: Payer: Self-pay

## 2024-06-18 VITALS — Ht 74.0 in | Wt 328.0 lb

## 2024-06-18 DIAGNOSIS — E8881 Metabolic syndrome: Secondary | ICD-10-CM

## 2024-06-18 DIAGNOSIS — R7303 Prediabetes: Secondary | ICD-10-CM

## 2024-06-18 HISTORY — DX: Sleep apnea, unspecified: G47.30

## 2024-06-18 HISTORY — DX: Hyperlipidemia, unspecified: E78.5

## 2024-06-18 HISTORY — DX: Prediabetes: R73.03

## 2024-06-18 NOTE — Telephone Encounter (Signed)
 Left message with preadmission department to call me back   Copied from CRM [id number]. Topic: Clinical - Request for Lab/Test Order >> Jun 18, 2024  8:42 AM Everette C wrote: Reason for CRM: Cherie with [facility] Pre Admission Testing has requested to be contacted by Dr. Franchot at [phone] ext [phone] when available. Cherie has called to discuss the patient being seen at [facility] rather than going to [facility] as well on 06/20/24 to discuss their options for

## 2024-06-18 NOTE — Patient Instructions (Addendum)
 Your procedure is scheduled on: Thursday 06/21/24 Report to the Registration Desk on the 1st floor of the Medical Mall. To find out your arrival time, please call [phone] between 1PM - 3PM on: Wednesday 06/20/24 If your arrival time is 6:00 am, do not arrive before that time as the Medical Mall entrance doors do not open until 6:00 am.  REMEMBER: Instructions that are not followed completely may result in serious medical risk, up to and including death; or upon the discretion of your surgeon and anesthesiologist your surgery may need to be rescheduled.  Do not eat food or ddrink any liquids after midnight the night before surgery.  No gum chewing or hard candies.  One week prior to surgery: Stop Anti-inflammatories (NSAIDS) such as Advil, Aleve, Ibuprofen, Motrin, Naproxen, Naprosyn and Aspirin based products such as Excedrin, Goody's Powder, BC Powder.  You may however, continue to take Tylenol  if needed for pain up until the day of surgery.  Stop ANY OVER THE COUNTER supplements and vitamins until after surgery.  **Follow guidelines for insulin and diabetes medications.** Hold Metformin  for 2 days. Last dose will be Monday 06/18/24  Continue taking all of your other prescription medications up until the day of surgery.  ON THE DAY OF SURGERY ONLY TAKE THESE MEDICATIONS WITH SIPS OF WATER:  amLODipine  (NORVASC ) 5 MG tablet  citalopram  (CELEXA ) 20 MG tablet  ezetimibe  (ZETIA ) 10 MG tablet  fenofibrate  (TRICOR ) 145 MG tablet  hydrALAZINE  (APRESOLINE ) 50 MG tablet   No Alcohol for 24 hours before or after surgery.  No Smoking including e-cigarettes for 24 hours before surgery.  No chewable tobacco products for at least 6 hours before surgery.  No nicotine patches on the day of surgery.  Do not use any recreational drugs for at least a week (preferably 2 weeks) before your surgery.  Please be advised that the combination of cocaine and anesthesia may have negative outcomes,  up to and including death. If you test positive for cocaine, your surgery will be cancelled.  On the morning of surgery brush your teeth with toothpaste and water, you may rinse your mouth with mouthwash if you wish. Do not swallow any toothpaste or mouthwash.  Shower prior to arrival for your procedure  Do not shave body hair from the neck down 48 hours before surgery.  Do not wear lotions, powders, or perfumes  Wear clean comfortable clothing (specific to your surgery type) to the hospital.  Do not wear jewelry, make-up, hairpins, clips or nail polish.  For welded (permanent) jewelry: bracelets, anklets, waist bands, etc.  Please have this removed prior to surgery.  If it is not removed, there is a chance that hospital personnel will need to cut it off on the day of surgery.  Contact lenses, hearing aids and dentures may not be worn into surgery.  Do not bring valuables to the hospital. [facility] is not responsible for any missing/lost belongings or valuables.   Bring your C-PAP to the hospital in case you may have to spend the night. May leave in car.  Notify your doctor if there is any change in your medical condition (cold, fever, infection).  After surgery, you can help prevent lung complications by doing breathing exercises.  Take deep breaths and cough every 1-2 hours. Your doctor may order a device called an Incentive Spirometer to help you take deep breaths.  When coughing or sneezing, hold a pillow firmly against your incision with your hands. This is called "splinting." Doing  this helps protect your incision. It also decreases discomfort.  IIf you are being discharged the day of surgery, you will not be allowed to drive home. You will need a responsible individual to drive you home and stay with you for 24 hours after surgery.   Please call the Pre-admissions Testing Dept. at [phone] if you have any questions about these instructions.  Surgery Visitation  Policy:  Patients having surgery or a procedure may have two visitors.  Children under the age of 70 must have an adult with them who is not the patient.   Merchandiser, Retail to address health-related social needs:  [email or web address]

## 2024-06-20 ENCOUNTER — Ambulatory Visit: Admitting: Family Medicine

## 2024-06-20 ENCOUNTER — Ambulatory Visit

## 2024-06-20 VITALS — BP 121/75 | HR 79 | Resp 16 | Ht 74.0 in | Wt 329.0 lb

## 2024-06-20 DIAGNOSIS — R7303 Prediabetes: Secondary | ICD-10-CM | POA: Diagnosis not present

## 2024-06-20 DIAGNOSIS — I10 Essential (primary) hypertension: Secondary | ICD-10-CM | POA: Diagnosis not present

## 2024-06-20 DIAGNOSIS — G4733 Obstructive sleep apnea (adult) (pediatric): Secondary | ICD-10-CM

## 2024-06-20 MED ORDER — CELECOXIB 200 MG PO CAPS
200.0000 mg | ORAL_CAPSULE | Freq: Once | ORAL | Status: AC
  Administered 2024-06-21: 200 mg via ORAL

## 2024-06-20 MED ORDER — VALSARTAN-HYDROCHLOROTHIAZIDE 160-25 MG PO TABS: 1.0000 | ORAL_TABLET | Freq: Every day | ORAL | 3 refills | Status: AC

## 2024-06-20 MED ORDER — ORAL CARE MOUTH RINSE: 15.0000 mL | Freq: Once | OROMUCOSAL | Status: AC

## 2024-06-20 MED ORDER — CEFAZOLIN SODIUM-DEXTROSE 2-4 GM/100ML-% IV SOLN
2.0000 g | INTRAVENOUS | Status: AC
  Administered 2024-06-21: 3 g via INTRAVENOUS

## 2024-06-20 MED ORDER — METFORMIN HCL ER 750 MG PO TB24: 750.0000 mg | ORAL_TABLET | Freq: Every evening | ORAL | 3 refills | Status: AC

## 2024-06-20 MED ORDER — ACETAMINOPHEN 500 MG PO TABS
1000.0000 mg | ORAL_TABLET | Freq: Once | ORAL | Status: AC
  Administered 2024-06-21: 1000 mg via ORAL

## 2024-06-20 MED ORDER — SODIUM CHLORIDE 0.9 % IV SOLN: INTRAVENOUS | Status: DC

## 2024-06-20 MED ORDER — CHLORHEXIDINE GLUCONATE 0.12 % MT SOLN
15.0000 mL | Freq: Once | OROMUCOSAL | Status: AC
  Administered 2024-06-21: 15 mL via OROMUCOSAL

## 2024-06-20 MED ORDER — HYDRALAZINE HCL 50 MG PO TABS: 50.0000 mg | ORAL_TABLET | Freq: Three times a day (TID) | ORAL | 3 refills | Status: AC

## 2024-06-20 NOTE — Addendum Note (Signed)
 Addended by: FRANCHOT RAKE A on: 06/20/2024 10:15 AM   Modules accepted: Level of Service

## 2024-06-20 NOTE — Anesthesia Preprocedure Evaluation (Addendum)
 Anesthesia Evaluation  Patient identified by MRN, date of birth, ID band Patient awake    Reviewed: Allergy & Precautions, H&P , NPO status , Patient's Chart, lab work & pertinent test results  Airway Mallampati: III  TM Distance: >3 FB Neck ROM: full    Dental no notable dental hx.    Pulmonary sleep apnea    Pulmonary exam normal        Cardiovascular hypertension, Normal cardiovascular exam     Neuro/Psych  PSYCHIATRIC DISORDERS Anxiety     negative neurological ROS     GI/Hepatic negative GI ROS, Neg liver ROS,,,  Endo/Other  negative endocrine ROS    Renal/GU negative Renal ROS  negative genitourinary   Musculoskeletal negative musculoskeletal ROS (+)    Abdominal  (+) + obese  Peds negative pediatric ROS (+)  Hematology negative hematology ROS (+)   Anesthesia Other Findings Past Medical History: No date: Anxiety No date: Asthma No date: HLD (hyperlipidemia) No date: Hypertension No date: Pre-diabetes No date: Sleep apnea  Past Surgical History: No date: NO PAST SURGERIES     Reproductive/Obstetrics negative OB ROS                              Anesthesia Physical Anesthesia Plan  ASA: 3  Anesthesia Plan: General   Post-op Pain Management: Tylenol  PO (pre-op)* and Celebrex PO (pre-op)*   Induction: Intravenous  PONV Risk Score and Plan: Dexamethasone and Ondansetron  Airway Management Planned: LMA  Additional Equipment:   Intra-op Plan:   Post-operative Plan: Extubation in OR  Informed Consent: I have reviewed the patients History and Physical, chart, labs and discussed the procedure including the risks, benefits and alternatives for the proposed anesthesia with the patient or authorized representative who has indicated his/her understanding and acceptance.     Dental Advisory Given  Plan Discussed with: CRNA and Surgeon  Anesthesia Plan Comments:           Anesthesia Quick Evaluation

## 2024-06-20 NOTE — Progress Notes (Signed)
 Established patient visit   Patient: James Cooley   DOB: [birth year]   38 y.o. Male  MRN: [id number] Visit Date: 06/20/2024  Today's healthcare provider: Isaiah DELENA Pepper, MD   Chief Complaint  Patient presents with   Follow-up    F/u BP/EKG    Subjective    HPI  Discussed the use of AI scribe software for clinical note transcription with the patient, who gave verbal consent to proceed.  History of Present Illness James Cooley is a 38 year old male with hypertension and prediabetes who presents for follow-up on blood pressure management and medication side effects.  He is on a regimen of three antihypertensive medications: amlodipine , hydralazine , and a combination pill of valsartan  and hydrochlorothiazide . Home blood pressure readings are well controlled, even when busy in the mornings. He takes hydralazine  three times a day, sometimes missing the lunch dose due to work, and keeps a supply at his office. All medications are taken in the morning except for metformin , which is taken at night.  He is taking metformin  for prediabetes but experiences gastrointestinal side effects, particularly in the mornings. Despite these side effects, no weight loss has been noted since starting metformin . He has previously discussed alternative medications with another provider, but insurance coverage has been a barrier. Previous attempts to obtain coverage for weight loss medications under the diagnosis of sleep apnea have been unsuccessful.  He is scheduled for a vasectomy tomorrow and has undergone an EKG, which was normal. The anesthesia team has informed him that he needs to complete blood work, including a CBC and tests for kidney function and electrolytes, prior to the procedure.   Medications: Outpatient Medications Prior to Visit  Medication Sig   acetaminophen  (TYLENOL ) 500 MG tablet Take 1,000 mg by mouth every 6 (six) hours as needed (pain.).   amLODipine  (NORVASC ) 5 MG  tablet Take 1 tablet (5 mg total) by mouth daily.   citalopram  (CELEXA ) 20 MG tablet Take 1 tablet (20 mg total) by mouth daily.   ezetimibe  (ZETIA ) 10 MG tablet Take 1 tablet (10 mg total) by mouth daily.   fenofibrate  (TRICOR ) 145 MG tablet Take 1 tablet (145 mg total) by mouth daily.   ibuprofen (ADVIL) 200 MG tablet Take 600-800 mg by mouth every 8 (eight) hours as needed (pain).   rosuvastatin  (CRESTOR ) 40 MG tablet Take 1 tablet (40 mg total) by mouth daily.   [DISCONTINUED] hydrALAZINE  (APRESOLINE ) 50 MG tablet Take 1 tablet (50 mg total) by mouth 3 (three) times daily.   [DISCONTINUED] metFORMIN  (GLUCOPHAGE -XR) 750 MG 24 hr tablet Take 1 tablet (750 mg total) by mouth every evening.   [DISCONTINUED] valsartan -hydrochlorothiazide  (DIOVAN -HCT) 160-25 MG tablet Take 1 tablet by mouth daily. TAKE IN PLACE OF LOSARTAN -HCTZ   No facility-administered medications prior to visit.    Review of Systems as noted in HPI.      Objective    BP 121/75 (BP Location: Right Arm, Patient Position: Sitting, Cuff Size: Large)   Pulse 79   Resp 16   Ht 6' 2 (1.88 m)   Wt (!) 329 lb (149.2 kg)   SpO2 97%   BMI 42.24 kg/m    Physical Exam Constitutional:      Appearance: Normal appearance.  HENT:     Head: Normocephalic and atraumatic.     Mouth/Throat:     Mouth: Mucous membranes are moist.  Eyes:     Pupils: Pupils are equal, round, and reactive to light.  Pulmonary:  Effort: Pulmonary effort is normal.  Skin:    General: Skin is warm.  Neurological:     General: No focal deficit present.     Mental Status: He is alert.      No results found for any visits on 06/20/24.  Assessment & Plan     Problem List Items Addressed This Visit       Cardiovascular and Mediastinum   Hypertension - Primary   Relevant Medications   valsartan -hydrochlorothiazide  (DIOVAN -HCT) 160-25 MG tablet   hydrALAZINE  (APRESOLINE ) 50 MG tablet   Other Relevant Orders   Basic metabolic panel with  GFR   EKG 12-Lead     Respiratory   OSA (obstructive sleep apnea)   Relevant Orders   CBC     Other   Prediabetes   Relevant Medications   metFORMIN  (GLUCOPHAGE -XR) 750 MG 24 hr tablet   Assessment & Plan Essential hypertension Chronic. Blood pressure controlled with current regimen. EKG NSR today. - Continue amlodipine , hydralazine , valsartan /hydrochlorothiazide . - Ordered BMP for pre-surgical evaluation. - EKG completed for for pre-surgical evaluation  Obstructive sleep apnea Chronic. Moderate to severe. Controlled, using CPAP. Insurance unfortunately does not cover weight loss medications. Discussed self-pay options. - Consider self-pay options for weight loss medications if insurance coverage is unavailable. - Continue CPAP - Ordered CBC for pre-surgical evaluation  Prediabetes On metformin  750 mg. A1c well controlled. - Continue metformin  750 mg.    Return in about 6 months (around 12/19/2024) for Follow Up.       Isaiah DELENA Pepper, MD  [facility] [phone] (phone) [phone] (fax)

## 2024-06-21 ENCOUNTER — Ambulatory Visit: Admitting: Anesthesiology

## 2024-06-21 ENCOUNTER — Ambulatory Visit: Admission: RE | Admit: 2024-06-21 | Discharge: 2024-06-21 | Disposition: A | Attending: Urology | Admitting: Urology

## 2024-06-21 ENCOUNTER — Other Ambulatory Visit: Payer: Self-pay

## 2024-06-21 ENCOUNTER — Encounter: Payer: Self-pay | Admitting: Urology

## 2024-06-21 ENCOUNTER — Encounter: Admission: RE | Disposition: A | Payer: Self-pay | Attending: Urology

## 2024-06-21 DIAGNOSIS — E8881 Metabolic syndrome: Secondary | ICD-10-CM

## 2024-06-21 DIAGNOSIS — I10 Essential (primary) hypertension: Secondary | ICD-10-CM | POA: Diagnosis not present

## 2024-06-21 DIAGNOSIS — Z302 Encounter for sterilization: Secondary | ICD-10-CM

## 2024-06-21 DIAGNOSIS — R7303 Prediabetes: Secondary | ICD-10-CM

## 2024-06-21 DIAGNOSIS — N491 Inflammatory disorders of spermatic cord, tunica vaginalis and vas deferens: Secondary | ICD-10-CM

## 2024-06-21 HISTORY — PX: VASECTOMY: SHX75

## 2024-06-21 LAB — CBC
Hematocrit: 43 % (ref 37.5–51.0)
Hemoglobin: 14.3 g/dL (ref 13.0–17.7)
MCH: 29.9 pg (ref 26.6–33.0)
MCHC: 33.3 g/dL (ref 31.5–35.7)
MCV: 90 fL (ref 79–97)
Platelets: 297 x10E3/uL (ref 150–450)
RBC: 4.79 x10E6/uL (ref 4.14–5.80)
RDW: 11.9 % (ref 11.6–15.4)
WBC: 5.8 x10E3/uL (ref 3.4–10.8)

## 2024-06-21 LAB — BASIC METABOLIC PANEL WITH GFR
BUN/Creatinine Ratio: 13 (ref 9–20)
BUN: 17 mg/dL (ref 6–20)
CO2: 22 mmol/L (ref 20–29)
Calcium: 9.6 mg/dL (ref 8.7–10.2)
Chloride: 100 mmol/L (ref 96–106)
Creatinine, Ser: 1.3 mg/dL — ABNORMAL HIGH (ref 0.76–1.27)
Glucose: 120 mg/dL — ABNORMAL HIGH (ref 70–99)
Potassium: 4 mmol/L (ref 3.5–5.2)
Sodium: 137 mmol/L (ref 134–144)
eGFR: 73 mL/min/1.73 (ref >59)

## 2024-06-21 LAB — GLUCOSE, CAPILLARY
Glucose-Capillary: 89 mg/dL (ref 70–99)
Glucose-Capillary: 135 mg/dL — ABNORMAL HIGH (ref 70–99)

## 2024-06-21 SURGERY — VASECTOMY
Anesthesia: Monitor Anesthesia Care | Site: Scrotum

## 2024-06-21 MED ORDER — KETAMINE HCL 50 MG/5ML IJ SOSY
PREFILLED_SYRINGE | INTRAMUSCULAR | Status: AC
  Filled 2024-06-21: qty 5

## 2024-06-21 MED ORDER — CEFAZOLIN SODIUM-DEXTROSE 1-4 GM/50ML-% IV SOLN
INTRAVENOUS | Status: AC
  Filled 2024-06-21: qty 50

## 2024-06-21 MED ORDER — PROPOFOL 10 MG/ML IV BOLUS
INTRAVENOUS | Status: AC
  Filled 2024-06-21: qty 20

## 2024-06-21 MED ORDER — EPHEDRINE 5 MG/ML INJ
INTRAVENOUS | Status: AC
  Filled 2024-06-21: qty 5

## 2024-06-21 MED ORDER — GLYCOPYRROLATE 0.2 MG/ML IJ SOLN
INTRAMUSCULAR | Status: DC | PRN
  Administered 2024-06-21: .2 mg via INTRAVENOUS

## 2024-06-21 MED ORDER — LIDOCAINE HCL 1 % IJ SOLN
INTRAMUSCULAR | Status: DC | PRN
  Administered 2024-06-21: 1 mL
  Administered 2024-06-21: 4 mL

## 2024-06-21 MED ORDER — PHENYLEPHRINE HCL-NACL 20-0.9 MG/250ML-% IV SOLN
INTRAVENOUS | Status: DC | PRN
  Administered 2024-06-21: 80 ug via INTRAVENOUS

## 2024-06-21 MED ORDER — FENTANYL CITRATE (PF) 100 MCG/2ML IJ SOLN
INTRAMUSCULAR | Status: DC | PRN
  Administered 2024-06-21 (×2): 50 ug via INTRAVENOUS

## 2024-06-21 MED ORDER — LIDOCAINE HCL (PF) 1 % IJ SOLN
INTRAMUSCULAR | Status: AC
  Filled 2024-06-21: qty 30

## 2024-06-21 MED ORDER — GLYCOPYRROLATE 0.2 MG/ML IJ SOLN
INTRAMUSCULAR | Status: AC
  Filled 2024-06-21: qty 1

## 2024-06-21 MED ORDER — FENTANYL CITRATE (PF) 100 MCG/2ML IJ SOLN
INTRAMUSCULAR | Status: AC
  Filled 2024-06-21: qty 2

## 2024-06-21 MED ORDER — 0.9 % SODIUM CHLORIDE (POUR BTL) OPTIME
TOPICAL | Status: DC | PRN
  Administered 2024-06-21: 500 mL

## 2024-06-21 MED ORDER — DROPERIDOL 2.5 MG/ML IJ SOLN: 0.6250 mg | Freq: Once | INTRAMUSCULAR | Status: DC | PRN

## 2024-06-21 MED ORDER — DEXAMETHASONE SOD PHOSPHATE PF 10 MG/ML IJ SOLN
INTRAMUSCULAR | Status: DC | PRN
  Administered 2024-06-21: 10 mg via INTRAVENOUS

## 2024-06-21 MED ORDER — ONDANSETRON HCL 4 MG/2ML IJ SOLN
INTRAMUSCULAR | Status: DC | PRN
  Administered 2024-06-21: 4 mg via INTRAVENOUS

## 2024-06-21 MED ORDER — PROPOFOL 10 MG/ML IV BOLUS
INTRAVENOUS | Status: DC | PRN
  Administered 2024-06-21: 250 mg via INTRAVENOUS

## 2024-06-21 MED ORDER — LIDOCAINE HCL (PF) 2 % IJ SOLN
INTRAMUSCULAR | Status: AC
  Filled 2024-06-21: qty 5

## 2024-06-21 MED ORDER — EPHEDRINE SULFATE-NACL 50-0.9 MG/10ML-% IV SOSY
PREFILLED_SYRINGE | INTRAVENOUS | Status: DC | PRN
  Administered 2024-06-21: 10 mg via INTRAVENOUS
  Administered 2024-06-21: 5 mg via INTRAVENOUS

## 2024-06-21 MED ORDER — MIDAZOLAM HCL (PF) 2 MG/2ML IJ SOLN
INTRAMUSCULAR | Status: DC | PRN
  Administered 2024-06-21: 2 mg via INTRAVENOUS

## 2024-06-21 MED ORDER — KETAMINE HCL 50 MG/5ML IJ SOSY
PREFILLED_SYRINGE | INTRAMUSCULAR | Status: DC | PRN
  Administered 2024-06-21: 20 mg via INTRAVENOUS

## 2024-06-21 MED ORDER — ACETAMINOPHEN 10 MG/ML IV SOLN: 1000.0000 mg | Freq: Once | INTRAVENOUS | Status: DC | PRN

## 2024-06-21 MED ORDER — OXYCODONE HCL 5 MG PO TABS
ORAL_TABLET | ORAL | Status: AC
  Filled 2024-06-21: qty 1

## 2024-06-21 MED ORDER — ACETAMINOPHEN 500 MG PO TABS
ORAL_TABLET | ORAL | Status: AC
  Filled 2024-06-21: qty 2

## 2024-06-21 MED ORDER — MIDAZOLAM HCL 2 MG/2ML IJ SOLN
INTRAMUSCULAR | Status: AC
  Filled 2024-06-21: qty 2

## 2024-06-21 MED ORDER — OXYCODONE HCL 5 MG/5ML PO SOLN: 5.0000 mg | Freq: Once | ORAL | Status: AC | PRN

## 2024-06-21 MED ORDER — LIDOCAINE HCL (CARDIAC) PF 100 MG/5ML IV SOSY
PREFILLED_SYRINGE | INTRAVENOUS | Status: DC | PRN
  Administered 2024-06-21: 100 mg via INTRAVENOUS

## 2024-06-21 MED ORDER — OXYCODONE HCL 5 MG PO TABS
5.0000 mg | ORAL_TABLET | Freq: Once | ORAL | Status: AC | PRN
  Administered 2024-06-21: 5 mg via ORAL

## 2024-06-21 MED ORDER — CELECOXIB 200 MG PO CAPS
ORAL_CAPSULE | ORAL | Status: AC
  Filled 2024-06-21: qty 1

## 2024-06-21 MED ORDER — FENTANYL CITRATE (PF) 100 MCG/2ML IJ SOLN: 25.0000 ug | INTRAMUSCULAR | Status: DC | PRN

## 2024-06-21 MED ORDER — ONDANSETRON HCL 4 MG/2ML IJ SOLN
INTRAMUSCULAR | Status: AC
  Filled 2024-06-21: qty 2

## 2024-06-21 MED ORDER — CHLORHEXIDINE GLUCONATE 0.12 % MT SOLN
OROMUCOSAL | Status: AC
  Filled 2024-06-21: qty 15

## 2024-06-21 SURGICAL SUPPLY — 25 items
BLADE CLIPPER SURG (BLADE) ×1 IMPLANT
BLADE SURG SZ11 CARB STEEL (BLADE) ×1 IMPLANT
CHLORAPREP W/TINT 26 (MISCELLANEOUS) ×1 IMPLANT
DERMABOND ADVANCED .7 DNX12 (GAUZE/BANDAGES/DRESSINGS) IMPLANT
DRAPE LAPAROTOMY 77X122 PED (DRAPES) ×1 IMPLANT
DRSG TELFA 3X4 N-ADH STERILE (GAUZE/BANDAGES/DRESSINGS) ×1 IMPLANT
ELECT BLADE 6.5 EXT (BLADE) ×1 IMPLANT
ELECTRODE CAUTERY NEDL TIP 1.0 (MISCELLANEOUS) IMPLANT
ELECTRODE REM PT RTRN 9FT ADLT (ELECTROSURGICAL) ×1 IMPLANT
GAUZE 4X4 16PLY ~~LOC~~+RFID DBL (SPONGE) ×1 IMPLANT
GAUZE SPONGE 4X4 12PLY STRL (GAUZE/BANDAGES/DRESSINGS) ×1 IMPLANT
GLOVE BIOGEL PI IND STRL 7.5 (GLOVE) ×1 IMPLANT
GOWN STRL REUS W/ TWL LRG LVL3 (GOWN DISPOSABLE) ×1 IMPLANT
GOWN STRL REUS W/ TWL XL LVL3 (GOWN DISPOSABLE) ×1 IMPLANT
KIT TURNOVER KIT A (KITS) ×1 IMPLANT
LABEL OR SOLS (LABEL) ×1 IMPLANT
MANIFOLD NEPTUNE II (INSTRUMENTS) ×1 IMPLANT
NDL HYPO 25X1 1.5 SAFETY (NEEDLE) ×1 IMPLANT
NS IRRIG 500ML POUR BTL (IV SOLUTION) ×1 IMPLANT
PACK BASIN MINOR ARMC (MISCELLANEOUS) ×1 IMPLANT
SOLN STERILE WATER 500 ML (IV SOLUTION) ×1 IMPLANT
SUPPORETR ATHLETIC LG (MISCELLANEOUS) ×1 IMPLANT
SUT CHROMIC 3-0 27 CP-2 (SUTURE) ×1 IMPLANT
SYR 10ML LL (SYRINGE) ×1 IMPLANT
TRAP FLUID SMOKE EVACUATOR (MISCELLANEOUS) ×1 IMPLANT

## 2024-06-21 NOTE — Discharge Instructions (Signed)

## 2024-06-21 NOTE — Transfer of Care (Signed)
 Immediate Anesthesia Transfer of Care Note  Patient: James Cooley  Procedure(s) Performed: VASECTOMY (Bilateral: Scrotum)  Patient Location: PACU  Anesthesia Type:General  Level of Consciousness: awake, alert , and oriented  Airway & Oxygen Therapy: Patient Spontanous Breathing and Patient connected to face mask oxygen  Post-op Assessment: Report given to RN and Post -op Vital signs reviewed and stable  Post vital signs: Reviewed and stable  Last Vitals:  Vitals Value Taken Time  BP 130/81 06/21/24 16:25  Temp 36.4 C 06/21/24 16:25  Pulse 95 06/21/24 16:29  Resp 11 06/21/24 16:29  SpO2 99 % 06/21/24 16:29  Vitals shown include unfiled device data.  Last Pain:  Vitals:   06/21/24 1625  TempSrc:   PainSc: 0-No pain         Complications: No notable events documented.

## 2024-06-21 NOTE — Interval H&P Note (Signed)
 History and Physical Interval Note:  06/21/2024 3:03 PM  James Cooley  has presented today for surgery, with the diagnosis of Desires Sterilization, Thick Spermatic Cord, Unable to palpate VAS.  The various methods of treatment have been discussed with the patient and family. After consideration of risks, benefits and other options for treatment, the patient has consented to  Procedures: VASECTOMY (Bilateral) EXPLORATION, SCROTUM (N/A) as a surgical intervention.  The patient's history has been reviewed, patient examined, no change in status, stable for surgery.  I have reviewed the patient's chart and labs.  Questions were answered to the patient's satisfaction.    CV: RRR Lungs: Clear  James Cooley

## 2024-06-21 NOTE — Anesthesia Procedure Notes (Signed)
 Procedure Name: LMA Insertion Date/Time: 06/21/2024 3:30 PM  Performed by: Jackye Spanner, CRNAPre-anesthesia Checklist: Patient identified, Patient being monitored, Timeout performed, Emergency Drugs available and Suction available Patient Re-evaluated:Patient Re-evaluated prior to induction Oxygen Delivery Method: Circle system utilized Preoxygenation: Pre-oxygenation with 100% oxygen Induction Type: IV induction Ventilation: Mask ventilation without difficulty LMA: LMA inserted LMA Size: 5.0 Tube type: Oral Number of attempts: 1 Placement Confirmation: positive ETCO2 and breath sounds checked- equal and bilateral Tube secured with: Tape Dental Injury: Teeth and Oropharynx as per pre-operative assessment  Comments: Smooth atraumatic LMA placement, no complications noted. Igel.

## 2024-06-21 NOTE — Op Note (Signed)
° ° °  Pre-operative Diagnosis: Elective sterilization  Post-operative Diagnosis: Elective sterilization  Procedure: Bilateral vasectomy  Surgeon: Glendia C. Macy Polio, M.D  Anesthesia: General/LMA  Complications: None  EBL: < 1 cc  Specimen: None  Indications: 38 y.o. male with undesired fertility.  At the time of his preoperative counseling he was noted to have a tight scrotum, thick spermatic cords and nonpalpable vasa.  He presents for vasectomy under anesthesia.   Description of procedure: The patient was taken to the operating room and general anesthesia was induced. The patient was placed in the supine position, prepped and draped in the usual sterile fashion.  Preoperative antibiotics were administered. A preoperative time-out was performed.   The right vas deferens was palpated, grasped and brought superiorly to the anterior scrotal skin. A skin puncture was made with a #12 blade and spread with the vas dissector.  The vas was grasped utilizing the vas clamp and elevated out of the incision. The vas sheath was incised and the vas was dissected out of the sheath, elevated and dissected free from the surrounding tissue and vessels.  A 1 cm segment was excised. The vas lumens were cauterized with a needle tip electrocautery for a distance of at least 1 cm.  Fascial interposition was performed on the testicular end with a figure-of-eight 3-0 chromic suture.   No significant bleeding was observed.  The vas ends were then dropped back into the hemiscrotum.    An identical procedure was performed on the contralateral side.    Surgical sites were closed with Dermabond.  After anesthetic reversal he was transported to the PACU in stable condition.  Plan: May resume intercourse in 7-10 days Alternate contraception until a 12-16-week semen sample shows azoospermia   Glendia Barba, MD

## 2024-06-21 NOTE — Anesthesia Postprocedure Evaluation (Signed)
 Anesthesia Post Note  Patient: James Cooley  Procedure(s) Performed: VASECTOMY (Bilateral: Scrotum)  Patient location during evaluation: PACU Anesthesia Type: General Level of consciousness: awake and alert Pain management: pain level controlled Vital Signs Assessment: post-procedure vital signs reviewed and stable Respiratory status: spontaneous breathing, nonlabored ventilation and respiratory function stable Cardiovascular status: blood pressure returned to baseline and stable Postop Assessment: no apparent nausea or vomiting Anesthetic complications: no   No notable events documented.   Last Vitals:  Vitals:   06/21/24 1700 06/21/24 1705  BP: (!) 130/90 (!) 146/87  Pulse: 92 (!) 101  Resp: 17 (!) 2  Temp:  36.6 C  SpO2: 94% 94%    Last Pain:  Vitals:   06/21/24 1715  TempSrc:   PainSc: 4                  Camellia Merilee Louder

## 2024-06-22 ENCOUNTER — Encounter: Payer: Self-pay | Admitting: Urology

## 2024-07-16 ENCOUNTER — Other Ambulatory Visit: Payer: Self-pay

## 2024-07-16 ENCOUNTER — Telehealth: Payer: Self-pay

## 2024-07-16 DIAGNOSIS — F39 Unspecified mood [affective] disorder: Secondary | ICD-10-CM

## 2024-07-16 MED ORDER — CITALOPRAM HYDROBROMIDE 20 MG PO TABS: 20.0000 mg | ORAL_TABLET | Freq: Every day | ORAL | 1 refills | Status: AC

## 2024-07-16 NOTE — Telephone Encounter (Signed)
 [facility] is asking for refills on Citalopram  Hydrobromide 20 mg. #90 with 3 RF

## 2024-09-20 ENCOUNTER — Other Ambulatory Visit

## 2024-12-19 ENCOUNTER — Ambulatory Visit
